# Patient Record
Sex: Female | Born: 1975
Health system: Southern US, Community
[De-identification: ages and names within clinical notes are randomized; demographics above are authoritative.]

## PROBLEM LIST (undated history)

## (undated) DIAGNOSIS — IMO0002 Reserved for concepts with insufficient information to code with codable children: Secondary | ICD-10-CM

## (undated) DIAGNOSIS — I456 Pre-excitation syndrome: Secondary | ICD-10-CM

## (undated) DIAGNOSIS — E538 Deficiency of other specified B group vitamins: Secondary | ICD-10-CM

## (undated) DIAGNOSIS — J302 Other seasonal allergic rhinitis: Secondary | ICD-10-CM

## (undated) DIAGNOSIS — R102 Pelvic and perineal pain: Secondary | ICD-10-CM

## (undated) DIAGNOSIS — Z973 Presence of spectacles and contact lenses: Secondary | ICD-10-CM

## (undated) DIAGNOSIS — I493 Ventricular premature depolarization: Secondary | ICD-10-CM

## (undated) HISTORY — DX: Pre-excitation syndrome: I45.6

## (undated) HISTORY — PX: LAPAROSCOPY FOR ECTOPIC PREGNANCY: SUR765

## (undated) HISTORY — DX: Ventricular premature depolarization: I49.3

## (undated) HISTORY — DX: Other seasonal allergic rhinitis: J30.2

---

## 2000-07-22 HISTORY — PX: BREAST ENHANCEMENT SURGERY: SHX7

## 2005-04-07 ENCOUNTER — Ambulatory Visit: Payer: Self-pay | Admitting: Surgery

## 2008-11-12 ENCOUNTER — Ambulatory Visit (HOSPITAL_COMMUNITY): Admission: RE | Admit: 2008-11-12 | Discharge: 2008-11-12 | Payer: Self-pay | Admitting: Gynecology

## 2008-12-31 ENCOUNTER — Ambulatory Visit (HOSPITAL_COMMUNITY): Admission: AD | Admit: 2008-12-31 | Discharge: 2008-12-31 | Payer: Self-pay | Admitting: Obstetrics and Gynecology

## 2008-12-31 ENCOUNTER — Encounter (INDEPENDENT_AMBULATORY_CARE_PROVIDER_SITE_OTHER): Payer: Self-pay | Admitting: Obstetrics and Gynecology

## 2009-11-02 ENCOUNTER — Inpatient Hospital Stay (HOSPITAL_COMMUNITY): Admission: AD | Admit: 2009-11-02 | Discharge: 2009-11-04 | Payer: Self-pay | Admitting: Obstetrics and Gynecology

## 2009-11-03 ENCOUNTER — Encounter (INDEPENDENT_AMBULATORY_CARE_PROVIDER_SITE_OTHER): Payer: Self-pay | Admitting: Obstetrics and Gynecology

## 2010-06-03 ENCOUNTER — Ambulatory Visit: Payer: Self-pay | Admitting: Internal Medicine

## 2010-09-07 LAB — CBC
HCT: 34.4 % — ABNORMAL LOW (ref 36.0–46.0)
HCT: 37.4 % (ref 36.0–46.0)
Hemoglobin: 12 g/dL (ref 12.0–15.0)
Hemoglobin: 12.9 g/dL (ref 12.0–15.0)
MCHC: 34.4 g/dL (ref 30.0–36.0)
MCHC: 35 g/dL (ref 30.0–36.0)
MCV: 95.6 fL (ref 78.0–100.0)
MCV: 96.1 fL (ref 78.0–100.0)
Platelets: 139 10*3/uL — ABNORMAL LOW (ref 150–400)
Platelets: 163 10*3/uL (ref 150–400)
RBC: 3.58 MIL/uL — ABNORMAL LOW (ref 3.87–5.11)
RBC: 3.91 MIL/uL (ref 3.87–5.11)
RDW: 13.3 % (ref 11.5–15.5)
RDW: 13.5 % (ref 11.5–15.5)
WBC: 12.1 10*3/uL — ABNORMAL HIGH (ref 4.0–10.5)
WBC: 13.5 10*3/uL — ABNORMAL HIGH (ref 4.0–10.5)

## 2010-09-07 LAB — CCBB MATERNAL DONOR DRAW

## 2010-09-07 LAB — RPR: RPR Ser Ql: NONREACTIVE

## 2010-09-27 LAB — CBC
HCT: 42.7 % (ref 36.0–46.0)
Hemoglobin: 14.8 g/dL (ref 12.0–15.0)
MCHC: 34.6 g/dL (ref 30.0–36.0)
MCV: 93.9 fL (ref 78.0–100.0)
Platelets: 194 10*3/uL (ref 150–400)
RBC: 4.54 MIL/uL (ref 3.87–5.11)
RDW: 11.4 % — ABNORMAL LOW (ref 11.5–15.5)
WBC: 8.5 10*3/uL (ref 4.0–10.5)

## 2010-09-27 LAB — ABO/RH: ABO/RH(D): A POS

## 2010-09-27 LAB — HCG, QUANTITATIVE, PREGNANCY: hCG, Beta Chain, Quant, S: 309 m[IU]/mL — ABNORMAL HIGH (ref <5)

## 2010-11-03 NOTE — H&P (Signed)
Dana Graves, Dana Graves           ACCOUNT NO.:  [id number]   MEDICAL RECORD NO.:  [id number]          PATIENT TYPE:  AMB   LOCATION:  [facility]                          FACILITY:  [facility]   PHYSICIAN:  Juluis Mire, M.D.   DATE OF BIRTH:  11-07-1975   DATE OF ADMISSION:  12/31/2008  DATE OF DISCHARGE:  12/31/2008                              HISTORY & PHYSICAL   The patient is a 35 year old, gravida 2, para 1, female with last  menstrual period of November 21, 2008.  She conceived on Clomid.  Has been  followed by Dr. Lorenso Courier with serial quantitative levels.  Has been on  progesterone supplementation.  Quantitative yesterday was 397.6.  Because of increasing pain and discomfort, came in today for an  ultrasound which revealed abundant free fluid.  Subsequent quantitative  urine on maternity admission had dropped to 309.  She had been admitted  for diagnostic laparoscopy to rule out ectopic.   In terms of allergies, she is allergic to AUGMENTIN and BIAXIN that  causes nausea.   MEDICATIONS:  On progesterone supplementation.   PAST MEDICAL HISTORY:  Usual childhood diseases.   SURGERY:  Previous breast augmentation and one previous vaginal  delivery.   FAMILY HISTORY:  Noncontributory.   Social history reveals no tobacco or alcohol use.   REVIEW OF SYSTEMS:  Noncontributory.   PHYSICAL EXAMINATION:  VITAL SIGNS:  The patient is afebrile, stable  vital signs.  LUNGS:  Clear.  CARDIOVASCULAR:  Regular rhythm and rate without murmurs or gallops.  ABDOMEN:  Moderate tenderness with rebound.  Bowel sounds are active.  PELVIC:  Deferred due to recent exam and ultrasound in the office.  NEUROLOGIC:  Grossly within normal limits.   Ultrasound in the office again revealed abundant free fluid, probable  ruptured ectopic pregnancy.  Blood type is undetermined.   IMPRESSION:  Possible ruptured ectopic pregnancy.   PLAN:  The patient had diagnostic laparoscopy with possible removal of  ectopic  versus tube.  The risk of surgery have been explained including  the risk of infection.  The risk of hemorrhage could require transfusion  with the risk of AIDS or hepatitis.  Risk of injury to adjacent organs  including bladder, bowel, ureters that could require further  exploratory surgery.  Risk of deep venous thrombosis and pulmonary  embolus.  Possibility for persistent ectopic pregnancy have been  discussed requiring further surgical or medical management.  The patient  expressed understanding and indications and risks.  Blood type will be  determined.      Juluis Mire, M.D.  Electronically Signed     JSM/MEDQ  D:  12/31/2008  T:  01/01/2009  Job:  [id number]

## 2010-11-03 NOTE — Op Note (Signed)
Dana Graves, Dana Graves           ACCOUNT NO.:  [id number]   MEDICAL RECORD NO.:  [id number]          PATIENT TYPE:  AMB   LOCATION:  [facility]                          [facility]ACILITY:  [facility]   PHYSICIAN:  Juluis Mire, M.D.   DATE OF BIRTH:  1976-04-16   DATE OF PROCEDURE:  12/31/2008  DATE OF DISCHARGE:  12/31/2008                               OPERATIVE REPORT   PREOPERATIVE DIAGNOSIS:  Possible ruptured ectopic pregnancy.   POSTOPERATIVE DIAGNOSIS:  Possible ruptured ectopic pregnancy with an  apparent aborting of the ectopic out the end of the left fallopian tube.   OPERATIVE PROCEDURE:  Laparoscopy with removal of apparent ectopic  pregnancy.   SURGEON:  Juluis Mire, MD   ANESTHESIA:  General.   ESTIMATED BLOOD LOSS:  200 to 300 mL, most of this was already  intraabdominal blood.   PACKS AND DRAINS:  None.   INJECTABLES PLACED:  None.   COMPLICATIONS:  None.   INDICATIONS:  Dictated in history and physical.   PROCEDURE:  The patient was taken to the OR, placed in the supine  position.  After satisfactory level of general endotracheal anesthesia  was obtained, the patient was placed in the dorsal lithotomy position  using the Allen stirrups.  At this point in time, the abdomen and  perineum were cleansed out with Betadine.  Foley was placed straight  drain.  The patient draped in sterile field.  A subumbilical incision  made with knife.  The Veress needle was introduced into abdominal  cavity.  Abdomen was inflated with approximately 3 L of carbon dioxide.  The operating laparoscope was then introduced.  Visualization did reveal  free blood in the pelvic cavity.  A 5-mm trocar was put in place in  suprapubic area, a second 5-mm trocar was put in place in the left lower  quadrant after visualization of epigastric vessels.  Visualization  revealed normal right tube and ovary.  The free blood in the pelvis was  irrigated out.  The left tube had a clot at the end with an  apparent  aborting of the ectopic out of the and there was no active bleeding.  The clot was removed and sent for pathological review.  Revisualization  of the pelvis revealed the uterus to be normal.  Actually, both tubes  looked nice and healthy.  There was no dilation of either tube and no  active bleeding.  The upper abdomen including liver, tip of the  gallbladder, and both lateral gutters were cleared.  We again irrigated  the pelvis, had no active bleeding.  The abdomen was deflated with  carbon dioxide.  All trocars were removed.  Subumbilical incision was  closed with interrupted subcuticular suture of 4-0 Vicryl.  The  suprapubic  incision was closed with Dermabond.  At this point in time, the patient  was taken out of dorsal lithotomy position.  Once alert and extubated,  transferred to recovery room in good condition.  Sponge, instrument, and  needle count was correct by circulating nurse x2.      Juluis Mire, M.D.  Electronically Signed  JSM/MEDQ  D:  12/31/2008  T:  01/01/2009  Job:  [id number]

## 2014-09-12 ENCOUNTER — Other Ambulatory Visit: Payer: Self-pay | Admitting: Obstetrics and Gynecology

## 2014-09-13 LAB — CYTOLOGY - PAP

## 2014-11-26 NOTE — H&P (Addendum)
38 yo with chronic pelvic pain and dysparaunia presents for surgical evaluation and mngt.  Pt with h/o endometriosis  PMHx:  Neg PshX:  SVD, breast augmentation, laparoscopy (ectopic) All:  None Meds:  OCPs Shx:  Negative tobacco FHx:  N/c  AF, VSS gen - NAD ABd - soft, NT CV - RRR Lungs - clear Ext - NT PV - uterus mobile NT, no adnexal masses  [place];  Normal uterus, no free fluid or adnexal masses  A/P:  Laparoscopy with possible fulgeration of em or LOA R/b/a discussed, questions answered, informed consent

## 2014-12-09 ENCOUNTER — Encounter (HOSPITAL_BASED_OUTPATIENT_CLINIC_OR_DEPARTMENT_OTHER): Payer: Self-pay | Admitting: *Deleted

## 2014-12-09 NOTE — Progress Notes (Signed)
NPO AFTER MN.  ARRIVE AT 0601.  NEEDS CBC, SERUM PREG., AND T & S.

## 2014-12-13 ENCOUNTER — Ambulatory Visit (HOSPITAL_BASED_OUTPATIENT_CLINIC_OR_DEPARTMENT_OTHER): Payer: BLUE CROSS/BLUE SHIELD | Admitting: Anesthesiology

## 2014-12-13 ENCOUNTER — Encounter (HOSPITAL_BASED_OUTPATIENT_CLINIC_OR_DEPARTMENT_OTHER): Payer: Self-pay | Admitting: *Deleted

## 2014-12-13 ENCOUNTER — Ambulatory Visit (HOSPITAL_BASED_OUTPATIENT_CLINIC_OR_DEPARTMENT_OTHER)
Admission: RE | Admit: 2014-12-13 | Discharge: 2014-12-13 | Disposition: A | Discharge status: Home / Self Care | Payer: BLUE CROSS/BLUE SHIELD | Source: Ambulatory Visit | Attending: Obstetrics and Gynecology | Admitting: Obstetrics and Gynecology

## 2014-12-13 ENCOUNTER — Encounter (HOSPITAL_BASED_OUTPATIENT_CLINIC_OR_DEPARTMENT_OTHER)
Admission: RE | Disposition: A | Discharge status: Home / Self Care | Payer: Self-pay | Source: Ambulatory Visit | Attending: Obstetrics and Gynecology

## 2014-12-13 DIAGNOSIS — D259 Leiomyoma of uterus, unspecified: Secondary | ICD-10-CM | POA: Insufficient documentation

## 2014-12-13 DIAGNOSIS — G8929 Other chronic pain: Secondary | ICD-10-CM | POA: Diagnosis not present

## 2014-12-13 DIAGNOSIS — N941 Dyspareunia: Secondary | ICD-10-CM | POA: Diagnosis present

## 2014-12-13 HISTORY — DX: Reserved for concepts with insufficient information to code with codable children: IMO0002

## 2014-12-13 HISTORY — DX: Presence of spectacles and contact lenses: Z97.3

## 2014-12-13 HISTORY — DX: Pelvic and perineal pain: R10.2

## 2014-12-13 HISTORY — PX: LAPAROSCOPY: SHX197

## 2014-12-13 LAB — TYPE AND SCREEN
ABO/RH(D): A POS
Antibody Screen: NEGATIVE

## 2014-12-13 LAB — CBC
HCT: 40.8 % (ref 36.0–46.0)
Hemoglobin: 13.7 g/dL (ref 12.0–15.0)
MCH: 30.9 pg (ref 26.0–34.0)
MCHC: 33.6 g/dL (ref 30.0–36.0)
MCV: 91.9 fL (ref 78.0–100.0)
Platelets: 180 10*3/uL (ref 150–400)
RBC: 4.44 MIL/uL (ref 3.87–5.11)
RDW: 12.1 % (ref 11.5–15.5)
WBC: 5.1 10*3/uL (ref 4.0–10.5)

## 2014-12-13 LAB — HCG, SERUM, QUALITATIVE: Preg, Serum: NEGATIVE

## 2014-12-13 LAB — ABO/RH: ABO/RH(D): A POS

## 2014-12-13 SURGERY — LAPAROSCOPY, DIAGNOSTIC
Anesthesia: General | Site: Abdomen

## 2014-12-13 MED ORDER — HYDROMORPHONE HCL 1 MG/ML IJ SOLN
0.2500 mg | INTRAMUSCULAR | Status: DC | PRN
  Filled 2014-12-13: qty 1

## 2014-12-13 MED ORDER — FENTANYL CITRATE (PF) 100 MCG/2ML IJ SOLN
INTRAMUSCULAR | Status: AC
  Filled 2014-12-13: qty 4

## 2014-12-13 MED ORDER — PROMETHAZINE HCL 25 MG/ML IJ SOLN
6.2500 mg | INTRAMUSCULAR | Status: DC | PRN
  Filled 2014-12-13: qty 1

## 2014-12-13 MED ORDER — GLYCOPYRROLATE 0.2 MG/ML IJ SOLN
INTRAMUSCULAR | Status: DC | PRN
  Administered 2014-12-13: 0.4 mg via INTRAVENOUS

## 2014-12-13 MED ORDER — FENTANYL CITRATE (PF) 100 MCG/2ML IJ SOLN
INTRAMUSCULAR | Status: DC | PRN
  Administered 2014-12-13: 50 ug via INTRAVENOUS
  Administered 2014-12-13: 100 ug via INTRAVENOUS

## 2014-12-13 MED ORDER — MIDAZOLAM HCL 2 MG/2ML IJ SOLN
0.5000 mg | Freq: Once | INTRAMUSCULAR | Status: DC | PRN
  Filled 2014-12-13: qty 2

## 2014-12-13 MED ORDER — LACTATED RINGERS IR SOLN
Status: DC | PRN
Start: 2014-12-13 — End: 2014-12-13
  Administered 2014-12-13: 3000 mL

## 2014-12-13 MED ORDER — ROCURONIUM BROMIDE 100 MG/10ML IV SOLN
INTRAVENOUS | Status: DC | PRN
  Administered 2014-12-13: 30 mg via INTRAVENOUS

## 2014-12-13 MED ORDER — MIDAZOLAM HCL 5 MG/5ML IJ SOLN
INTRAMUSCULAR | Status: DC | PRN
  Administered 2014-12-13: 2 mg via INTRAVENOUS

## 2014-12-13 MED ORDER — PROPOFOL 10 MG/ML IV BOLUS
INTRAVENOUS | Status: DC | PRN
  Administered 2014-12-13: 150 mg via INTRAVENOUS

## 2014-12-13 MED ORDER — CEFOTETAN DISODIUM-DEXTROSE 2-2.08 GM-% IV SOLR
INTRAVENOUS | Status: AC
  Filled 2014-12-13: qty 50

## 2014-12-13 MED ORDER — NEOSTIGMINE METHYLSULFATE 10 MG/10ML IV SOLN
INTRAVENOUS | Status: DC | PRN
  Administered 2014-12-13: 3 mg via INTRAVENOUS

## 2014-12-13 MED ORDER — MIDAZOLAM HCL 2 MG/2ML IJ SOLN
INTRAMUSCULAR | Status: AC
  Filled 2014-12-13: qty 2

## 2014-12-13 MED ORDER — MEPERIDINE HCL 25 MG/ML IJ SOLN
6.2500 mg | INTRAMUSCULAR | Status: DC | PRN
  Filled 2014-12-13: qty 1

## 2014-12-13 MED ORDER — LACTATED RINGERS IV SOLN
INTRAVENOUS | Status: DC
  Administered 2014-12-13 (×2): via INTRAVENOUS
  Filled 2014-12-13: qty 1000

## 2014-12-13 MED ORDER — OXYCODONE-ACETAMINOPHEN 5-325 MG PO TABS: 1.0000 | ORAL_TABLET | ORAL | Status: DC | PRN

## 2014-12-13 MED ORDER — LIDOCAINE HCL (CARDIAC) 20 MG/ML IV SOLN
INTRAVENOUS | Status: DC | PRN
  Administered 2014-12-13: 20 mg via INTRAVENOUS

## 2014-12-13 MED ORDER — DEXAMETHASONE SODIUM PHOSPHATE 4 MG/ML IJ SOLN
INTRAMUSCULAR | Status: DC | PRN
  Administered 2014-12-13: 10 mg via INTRAVENOUS

## 2014-12-13 MED ORDER — ONDANSETRON HCL 4 MG/2ML IJ SOLN
INTRAMUSCULAR | Status: DC | PRN
  Administered 2014-12-13: 4 mg via INTRAVENOUS

## 2014-12-13 MED ORDER — DEXTROSE 5 % IV SOLN
2.0000 g | INTRAVENOUS | Status: AC
  Administered 2014-12-13: 2 g via INTRAVENOUS
  Filled 2014-12-13: qty 2

## 2014-12-13 MED ORDER — IBUPROFEN 600 MG PO TABS: 600.0000 mg | ORAL_TABLET | Freq: Four times a day (QID) | ORAL | Status: DC | PRN

## 2014-12-13 MED ORDER — ACETAMINOPHEN 10 MG/ML IV SOLN
INTRAVENOUS | Status: DC | PRN
  Administered 2014-12-13: 1000 mg via INTRAVENOUS

## 2014-12-13 MED ORDER — BUPIVACAINE HCL (PF) 0.25 % IJ SOLN
INTRAMUSCULAR | Status: DC | PRN
Start: 2014-12-13 — End: 2014-12-13
  Administered 2014-12-13: 15 mL

## 2014-12-13 MED ORDER — KETOROLAC TROMETHAMINE 30 MG/ML IJ SOLN
INTRAMUSCULAR | Status: DC | PRN
  Administered 2014-12-13: 30 mg via INTRAVENOUS

## 2014-12-13 SURGICAL SUPPLY — 33 items
APPLICATOR COTTON TIP 6IN STRL (MISCELLANEOUS) ×2 IMPLANT
BLADE SURG 11 STRL SS (BLADE) ×2 IMPLANT
CANISTER SUCTION 2500CC (MISCELLANEOUS) ×2 IMPLANT
CATH ROBINSON RED A/P 16FR (CATHETERS) ×2 IMPLANT
CHLORAPREP W/TINT 26ML (MISCELLANEOUS) ×2 IMPLANT
DRAPE UNDERBUTTOCKS STRL (DRAPE) ×2 IMPLANT
ELECT REM PT RETURN 9FT ADLT (ELECTROSURGICAL) ×2
ELECTRODE REM PT RTRN 9FT ADLT (ELECTROSURGICAL) ×1 IMPLANT
GLOVE BIO SURGEON STRL SZ 6.5 (GLOVE) ×4 IMPLANT
GLOVE BIO SURGEON STRL SZ7 (GLOVE) ×2 IMPLANT
GLOVE INDICATOR 7.0 STRL GRN (GLOVE) ×4 IMPLANT
GOWN STRL REUS W/ TWL LRG LVL3 (GOWN DISPOSABLE) ×3 IMPLANT
GOWN STRL REUS W/TWL LRG LVL3 (GOWN DISPOSABLE) ×3
LIQUID BAND (GAUZE/BANDAGES/DRESSINGS) ×2 IMPLANT
NEEDLE HYPO 25X1 1.5 SAFETY (NEEDLE) ×2 IMPLANT
NS IRRIG 500ML POUR BTL (IV SOLUTION) ×2 IMPLANT
PACK BASIN DAY SURGERY FS (CUSTOM PROCEDURE TRAY) ×2 IMPLANT
PACK LAPAROSCOPY II (CUSTOM PROCEDURE TRAY) ×2 IMPLANT
PAD OB MATERNITY 4.3X12.25 (PERSONAL CARE ITEMS) ×2 IMPLANT
PAD PREP 24X48 CUFFED NSTRL (MISCELLANEOUS) ×2 IMPLANT
PADDING ION DISPOSABLE (MISCELLANEOUS) ×2 IMPLANT
SET IRRIG TUBING LAPAROSCOPIC (IRRIGATION / IRRIGATOR) ×2 IMPLANT
SOLUTION ANTI FOG 6CC (MISCELLANEOUS) ×2 IMPLANT
SUT VIC AB 3-0 PS2 18 (SUTURE) ×1
SUT VIC AB 3-0 PS2 18XBRD (SUTURE) ×1 IMPLANT
SUT VICRYL 0 UR6 27IN ABS (SUTURE) ×2 IMPLANT
SYR 3ML 23GX1 SAFETY (SYRINGE) ×2 IMPLANT
SYR CONTROL 10ML LL (SYRINGE) ×2 IMPLANT
TOWEL OR 17X24 6PK STRL BLUE (TOWEL DISPOSABLE) ×4 IMPLANT
TRAY DSU PREP LF (CUSTOM PROCEDURE TRAY) ×2 IMPLANT
TROCAR OPTI TIP 5M 100M (ENDOMECHANICALS) ×2 IMPLANT
TROCAR XCEL NON-BLD 11X100MML (ENDOMECHANICALS) ×2 IMPLANT
TUBING INSUFFLATION 10FT LAP (TUBING) ×2 IMPLANT

## 2014-12-13 NOTE — Discharge Instructions (Signed)
Diagnostic Laparoscopy Laparoscopy is a surgical procedure. It is used to diagnose and treat diseases inside the belly (abdomen). It is usually a brief, common, and relatively simple procedure. The laparoscopeis a thin, lighted, pencil-sized instrument. It is like a telescope. It is inserted into your abdomen through a small cut (incision). Your caregiver can look at the organs inside your body through this instrument. He or she can see if there is anything abnormal. Laparoscopy can be done either in a hospital or outpatient clinic. You may be given a mild sedative to help you relax before the procedure. Once in the operating room, you will be given a drug to make you sleep (general anesthesia). Laparoscopy usually lasts less than 1 hour. After the procedure, you will be monitored in a recovery area until you are stable and doing well. Once you are home, it will take 2 to 3 days to fully recover. RISKS AND COMPLICATIONS  Laparoscopy has relatively few risks. Your caregiver will discuss the risks with you before the procedure. Some problems that can occur include:  Infection.  Bleeding.  Damage to other organs.  Anesthetic side effects. PROCEDURE Once you receive anesthesia, your surgeon inflates the abdomen with a harmless gas (carbon dioxide). This makes the organs easier to see. The laparoscope is inserted into the abdomen through a small incision. This allows your surgeon to see into the abdomen. Other small instruments are also inserted into the abdomen through other small openings. Many surgeons attach a video camera to the laparoscope to enlarge the view. During a diagnostic laparoscopy, the surgeon may be looking for inflammation, infection, or cancer. Your surgeon may take tissue samples(biopsies). The samples are sent to a specialist in looking at cells and tissue samples (pathologist). The pathologist examines them under a microscope. Biopsies can help to diagnose or confirm a  disease. AFTER THE PROCEDURE   The gas is released from inside the abdomen.  The incisions are closed with stitches (sutures). Because these incisions are small (usually less than 1/2 inch), there is usually minimal discomfort after the procedure. There may be some mild discomfort in the throat. This is from the tube placed in the throat while you were sleeping. You may have some mild abdominal discomfort. There may also be discomfort from the instrument placement incisions in the abdomen.  The recovery time is shortened as long as there are no complications.  You will rest in a recovery room until stable and doing well. As long as there are no complications, you may be allowed to go home. FINDING OUT THE RESULTS OF YOUR TEST Not all test results are available during your visit. If your test results are not back during the visit, make an appointment with your caregiver to find out the results. Do not assume everything is normal if you have not heard from your caregiver or the medical facility. It is important for you to follow up on all of your test results. HOME CARE INSTRUCTIONS   Take all medicines as directed.  Only take over-the-counter or prescription medicines for pain, discomfort, or fever as directed by your caregiver.  Resume daily activities as directed.  Showers are preferred over baths.  You may resume sexual activities in 1 week or as directed.  Do not drive while taking narcotics. SEEK MEDICAL CARE IF:   There is increasing abdominal pain.  There is new pain in the shoulders (shoulder strap areas).  You feel lightheaded or faint.  You have the chills.  You or  your child has an oral temperature above 102 F (38.9 C).  There is pus-like (purulent) drainage from any of the wounds.  You are unable to pass gas or have a bowel movement.  You feel sick to your stomach (nauseous) or throw up (vomit). MAKE SURE YOU:   Understand these instructions.  Will watch  your condition.  Will get help right away if you are not doing well or get worse. Document Released: 09/13/2000 Document Revised: 10/02/2012 Document Reviewed: 06/07/2007 [employer] Patient Information 2015 [place], [place]. This information is not  Post Anesthesia Home Care Instructions  Activity: Get plenty of rest for the remainder of the day. A responsible adult should stay with you for 24 hours following the procedure.  For the next 24 hours, DO NOT: -Drive a car -Paediatric nurse -Drink alcoholic beverages -Take any medication unless instructed by your physician -Make any legal decisions or sign important papers.  Meals: Start with liquid foods such as gelatin or soup. Progress to regular foods as tolerated. Avoid greasy, spicy, heavy foods. If nausea and/or vomiting occur, drink only clear liquids until the nausea and/or vomiting subsides. Call your physician if vomiting continues.  Special Instructions/Symptoms: Your throat may feel dry or sore from the anesthesia or the breathing tube placed in your throat during surgery. If this causes discomfort, gargle with warm salt water. The discomfort should disappear within 24 hours.  If you had a scopolamine patch placed behind your ear for the management of post- operative nausea and/or vomiting:  1. The medication in the patch is effective for 72 hours, after which it should be removed.  Wrap patch in a tissue and discard in the trash. Wash hands thoroughly with soap and water. 2. You may remove the patch earlier than 72 hours if you experience unpleasant side effects which may include dry mouth, dizziness or visual disturbances. 3. Avoid touching the patch. Wash your hands with soap and water after contact with the patch.    Post Anesthesia Home Care Instructions  Activity: Get plenty of rest for the remainder of the day. A responsible adult should stay with you for 24 hours following the procedure.  For the next 24 hours, DO  NOT: -Drive a car -Paediatric nurse -Drink alcoholic beverages -Take any medication unless instructed by your physician -Make any legal decisions or sign important papers.  Meals: Start with liquid foods such as gelatin or soup. Progress to regular foods as tolerated. Avoid greasy, spicy, heavy foods. If nausea and/or vomiting occur, drink only clear liquids until the nausea and/or vomiting subsides. Call your physician if vomiting continues.  Special Instructions/Symptoms: Your throat may feel dry or sore from the anesthesia or the breathing tube placed in your throat during surgery. If this causes discomfort, gargle with warm salt water. The discomfort should disappear within 24 hours.  If you had a scopolamine patch placed behind your ear for the management of post- operative nausea and/or vomiting:  1. The medication in the patch is effective for 72 hours, after which it should be removed.  Wrap patch in a tissue and discard in the trash. Wash hands thoroughly with soap and water. 2. You may remove the patch earlier than 72 hours if you experience unpleasant side effects which may include dry mouth, dizziness or visual disturbances. 3. Avoid touching the patch. Wash your hands with soap and water after contact with the patch.   intended to replace advice given to you by your health care provider. Make sure you  discuss any questions you have with your health care provider.

## 2014-12-13 NOTE — Op Note (Signed)
Dana Graves, Dana Graves           ACCOUNT NO.:  [id number]  MEDICAL RECORD NO.:  [id number]  LOCATION:                               FACILITY:  [facility]  PHYSICIAN:  Marylynn Pearson, MD    DATE OF BIRTH:  03-27-1976  DATE OF PROCEDURE:  12/13/2014 DATE OF DISCHARGE:  12/13/2014                              OPERATIVE REPORT   PREOPERATIVE DIAGNOSIS:  Dyspareunia.  POSTOPERATIVE DIAGNOSIS:  Dyspareunia.  PROCEDURE:  Diagnostic laparoscopy.  SURGEON:  Marylynn Pearson, MD  BLOOD LOSS:  Minimal.  COMPLICATIONS:  None.  SPECIMENS:  None.  CONDITION:  Stable to recovery room.  PROCEDURE IN DETAIL:  The patient was taken to the operating room. After informed consent was obtained, she was given general anesthesia and placed in the dorsal lithotomy position using Allen stirrups.  She was prepped and draped in sterile fashion.  In-and-out catheter was used to drain her bladder.  Bivalve speculum was placed in the vagina and a single-tooth tenaculum was attached to the anterior lip of the cervix. The Hulka clamp was placed.  Tenaculum and speculum were removed and our attention was turned to the abdomen.  An umbilical incision was made with a scalpel and extended bluntly to the fascia using a Kelly clamp.  Optical trocar was then inserted under direct visualization.  Once intraperitoneal placement was confirmed, CO2 was turned on and a survey of the abdomen and pelvis were performed. Bilateral ovaries and fallopian tubes appeared free of adhesions with no abnormalities.  The uterus had a very small fundal fibroid, but was mobile without abnormalities.  Posterior cul-de-sac and ovarian fossa, both were visualized and appeared normal.  No evidence of endometriosis or adhesions within the pelvis.  Right upper quadrant was visualized and also appeared normal.  All trocars and instruments were then removed from the abdomen.  CO2 was turned off and a deep stitch was placed  in the umbilical incision and the skin was closed with Vicryl.  Dermabond was placed over both incisions.  The Hulka clamp was removed.  She was extubated and taken to the recovery room in stable condition.  Sponge, lap, needle, and instrument counts were correct x2.     Marylynn Pearson, MD     GA/MEDQ  D:  12/13/2014  T:  12/13/2014  Job:  [id number]

## 2014-12-13 NOTE — Anesthesia Procedure Notes (Signed)
Procedure Name: Intubation Date/Time: 12/13/2014 7:29 AM Performed by: Bethena Roys T Pre-anesthesia Checklist: Patient identified, Emergency Drugs available, Suction available and Patient being monitored Patient Re-evaluated:Patient Re-evaluated prior to inductionOxygen Delivery Method: Circle System Utilized Preoxygenation: Pre-oxygenation with 100% oxygen Intubation Type: IV induction Ventilation: Mask ventilation without difficulty Laryngoscope Size: Mac and 3 Grade View: Grade I Tube type: Oral Number of attempts: 2 (first look , Grade 3 view, waited and intubated easily on 2nd look) Airway Equipment and Method: Stylet and Oral airway Placement Confirmation: ETT inserted through vocal cords under direct vision,  positive ETCO2 and breath sounds checked- equal and bilateral Tube secured with: Tape Dental Injury: Teeth and Oropharynx as per pre-operative assessment

## 2014-12-13 NOTE — Anesthesia Preprocedure Evaluation (Addendum)
Anesthesia Evaluation  Patient identified by MRN, date of birth, ID band Patient awake    Reviewed: Allergy & Precautions, NPO status , Patient's Chart, lab work & pertinent test results  History of Anesthesia Complications Negative for: history of anesthetic complications  Airway Mallampati: I  TM Distance: >3 FB Neck ROM: Full    Dental  (+) Dental Advisory Given, Teeth Intact   Pulmonary neg pulmonary ROS,  breath sounds clear to auscultation        Cardiovascular negative cardio ROS  Rhythm:Regular Rate:Normal     Neuro/Psych negative neurological ROS     GI/Hepatic negative GI ROS, Neg liver ROS,   Endo/Other  negative endocrine ROS  Renal/GU negative Renal ROS     Musculoskeletal   Abdominal   Peds  Hematology negative hematology ROS (+)   Anesthesia Other Findings   Reproductive/Obstetrics 12/13/14 preg test: NEG                            Anesthesia Physical Anesthesia Plan  ASA: I  Anesthesia Plan: General   Post-op Pain Management:    Induction: Intravenous  Airway Management Planned: Oral ETT  Additional Equipment:   Intra-op Plan:   Post-operative Plan: Extubation in OR  Informed Consent: I have reviewed the patients History and Physical, chart, labs and discussed the procedure including the risks, benefits and alternatives for the proposed anesthesia with the patient or authorized representative who has indicated his/her understanding and acceptance.   Dental advisory given  Plan Discussed with: CRNA and Surgeon  Anesthesia Plan Comments: (Plan routine monitors, GETA)        Anesthesia Quick Evaluation

## 2014-12-13 NOTE — Transfer of Care (Signed)
Immediate Anesthesia Transfer of Care Note  Patient: Dana Graves  Procedure(s) Performed: Procedure(s): LAPAROSCOPY DIAGNOSTIC  (N/A)  Patient Location: PACU  Anesthesia Type:General  Level of Consciousness: awake, alert  and oriented  Airway & Oxygen Therapy: Patient Spontanous Breathing, O2 per [place]   Post-op Assessment: Report given to RN and Post -op Vital signs reviewed and stable  Post vital signs: Reviewed and stable  Last Vitals:  Filed Vitals:   12/13/14 0603  BP: 100/64  Pulse: 61  Temp: 36.9 C  Resp: 14    Complications: No apparent anesthesia complications

## 2014-12-13 NOTE — Anesthesia Postprocedure Evaluation (Signed)
  Anesthesia Post-op Note  Patient: Dana Graves  Procedure(s) Performed: Procedure(s): LAPAROSCOPY DIAGNOSTIC  (N/A)  Patient Location: PACU  Anesthesia Type:General  Level of Consciousness: awake, alert , oriented and patient cooperative  Airway and Oxygen Therapy: Patient Spontanous Breathing  Post-op Pain: none  Post-op Assessment: Post-op Vital signs reviewed, Patient's Cardiovascular Status Stable, Respiratory Function Stable, Patent Airway, No signs of Nausea or vomiting and Pain level controlled              Post-op Vital Signs: Reviewed and stable  Last Vitals:  Filed Vitals:   12/13/14 0900  BP: 109/60  Pulse: 52  Temp:   Resp: 11    Complications: No apparent anesthesia complications

## 2014-12-16 ENCOUNTER — Encounter (HOSPITAL_BASED_OUTPATIENT_CLINIC_OR_DEPARTMENT_OTHER): Payer: Self-pay | Admitting: Obstetrics and Gynecology

## 2015-02-04 ENCOUNTER — Ambulatory Visit: Payer: Self-pay | Admitting: Family Medicine

## 2015-02-12 ENCOUNTER — Encounter (INDEPENDENT_AMBULATORY_CARE_PROVIDER_SITE_OTHER): Payer: Self-pay

## 2015-02-12 ENCOUNTER — Encounter: Payer: Self-pay | Admitting: Family Medicine

## 2015-02-12 ENCOUNTER — Ambulatory Visit (INDEPENDENT_AMBULATORY_CARE_PROVIDER_SITE_OTHER): Payer: BLUE CROSS/BLUE SHIELD | Admitting: Family Medicine

## 2015-02-12 VITALS — BP 124/72 | HR 76 | Temp 98.2°F | Ht 67.5 in | Wt 140.8 lb

## 2015-02-12 DIAGNOSIS — Z01419 Encounter for gynecological examination (general) (routine) without abnormal findings: Secondary | ICD-10-CM

## 2015-02-12 DIAGNOSIS — R140 Abdominal distension (gaseous): Secondary | ICD-10-CM

## 2015-02-12 DIAGNOSIS — Z0000 Encounter for general adult medical examination without abnormal findings: Secondary | ICD-10-CM

## 2015-02-12 LAB — COMPREHENSIVE METABOLIC PANEL
ALT: 13 U/L (ref 0–35)
AST: 24 U/L (ref 0–37)
Albumin: 4.6 g/dL (ref 3.5–5.2)
Alkaline Phosphatase: 46 U/L (ref 39–117)
BUN: 19 mg/dL (ref 6–23)
CO2: 27 mEq/L (ref 19–32)
Calcium: 9.5 mg/dL (ref 8.4–10.5)
Chloride: 104 mEq/L (ref 96–112)
Creatinine, Ser: 0.78 mg/dL (ref 0.40–1.20)
GFR: 86.59 mL/min (ref >60.00)
Glucose, Bld: 89 mg/dL (ref 70–99)
Potassium: 4.7 mEq/L (ref 3.5–5.1)
Sodium: 138 mEq/L (ref 135–145)
Total Bilirubin: 0.4 mg/dL (ref 0.2–1.2)
Total Protein: 7.5 g/dL (ref 6.0–8.3)

## 2015-02-12 LAB — CBC WITH DIFFERENTIAL/PLATELET
Basophils Absolute: 0 10*3/uL (ref 0.0–0.1)
Basophils Relative: 0.3 % (ref 0.0–3.0)
Eosinophils Absolute: 0.1 10*3/uL (ref 0.0–0.7)
Eosinophils Relative: 1.2 % (ref 0.0–5.0)
HCT: 41.6 % (ref 36.0–46.0)
Hemoglobin: 14.3 g/dL (ref 12.0–15.0)
Lymphocytes Relative: 28.2 % (ref 12.0–46.0)
Lymphs Abs: 2.1 10*3/uL (ref 0.7–4.0)
MCHC: 34.3 g/dL (ref 30.0–36.0)
MCV: 92 fl (ref 78.0–100.0)
Monocytes Absolute: 0.6 10*3/uL (ref 0.1–1.0)
Monocytes Relative: 7.6 % (ref 3.0–12.0)
Neutro Abs: 4.7 10*3/uL (ref 1.4–7.7)
Neutrophils Relative %: 62.7 % (ref 43.0–77.0)
Platelets: 230 10*3/uL (ref 150.0–400.0)
RBC: 4.53 Mil/uL (ref 3.87–5.11)
RDW: 11.9 % (ref 11.5–15.5)
WBC: 7.6 10*3/uL (ref 4.0–10.5)

## 2015-02-12 LAB — H. PYLORI ANTIBODY, IGG: H Pylori IgG: NEGATIVE

## 2015-02-12 LAB — VITAMIN B12: Vitamin B-12: 225 pg/mL (ref 211–911)

## 2015-02-12 LAB — LIPID PANEL
Cholesterol: 164 mg/dL (ref 0–200)
HDL: 58.2 mg/dL (ref >39.00)
LDL Cholesterol: 73 mg/dL (ref 0–99)
NonHDL: 105.89
Total CHOL/HDL Ratio: 3
Triglycerides: 163 mg/dL — ABNORMAL HIGH (ref 0.0–149.0)
VLDL: 32.6 mg/dL (ref 0.0–40.0)

## 2015-02-12 LAB — MAGNESIUM: Magnesium: 2 mg/dL (ref 1.5–2.5)

## 2015-02-12 LAB — VITAMIN D 25 HYDROXY (VIT D DEFICIENCY, FRACTURES): VITD: 47.34 ng/mL (ref 30.00–100.00)

## 2015-02-12 LAB — TSH: TSH: 1.01 u[IU]/mL (ref 0.35–4.50)

## 2015-02-12 NOTE — Assessment & Plan Note (Signed)
Reviewed preventive care protocols, scheduled due services, and updated immunizations Discussed nutrition, exercise, diet, and healthy lifestyle.  Orders Placed This Encounter  Procedures  . CBC with Differential/Platelet  . Comprehensive metabolic panel  . Lipid panel  . TSH  . Magnesium  . Vitamin B12  . Vitamin D, 25-hydroxy  . H. pylori antibody, IgG

## 2015-02-12 NOTE — Patient Instructions (Signed)
Nice to meet you. We will call you with your lab results and you can view them online.  Try VSL #3 or align OTC

## 2015-02-12 NOTE — Progress Notes (Signed)
Pre visit review using our clinic review tool, if applicable. No additional management support is needed unless otherwise documented below in the visit note. 

## 2015-02-12 NOTE — Progress Notes (Signed)
Subjective:   Patient ID: Dana Graves, female    DOB: [birth year], 39 y.o.   MRN: [id number]  Dana Graves is a pleasant 39 y.o. year old female who presents to clinic today with Establish Care  on 02/12/2015  HPI: G3P2- in good health. Has OBGYN-  Dr. Orvan Seen.  Last pap and mammogram in 06/2014.  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  Very active.  Has been having more abdominal bloating and gassiness.  No changes in bowel habits or abdominal pain.  No blood in stool, nausea or vomiting.  Had a laparoscopy which ruled out endometriosis.  Has not tried gas x or probiotics.  Does not seem to affected by dairy or gluten.  Current Outpatient Prescriptions on File Prior to Visit  Medication Sig Dispense Refill  . norgestimate-ethinyl estradiol (ORTHO-CYCLEN,SPRINTEC,PREVIFEM) 0.25-35 MG-MCG tablet Take 1 tablet by mouth daily.     No current facility-administered medications on file prior to visit.    No Known Allergies  Past Medical History  Diagnosis Date  . Pelvic pain in female   . Wears contact lenses   . Dyspareunia     Past Surgical History  Procedure Laterality Date  . Laparoscopy for ectopic pregnancy  2009 approx.    Unilateral Salpingostomy  . Breast enhancement surgery  feb  2002  . Laparoscopy N/A 12/13/2014    Procedure: LAPAROSCOPY DIAGNOSTIC ;  Surgeon: Marylynn Pearson, MD;  Location: [facility];  Service: Gynecology;  Laterality: N/A;    Family History  Problem Relation Age of Onset  . Cancer Maternal Grandmother   . Cancer Paternal Grandfather   . Sudden death Paternal Grandfather     Social History   Social History  . Marital Status: Married    Spouse Name: N/A  . Number of Children: N/A  . Years of Education: N/A   Occupational History  . Not on file.   Social History Main Topics  . Smoking status: Never Smoker   . Smokeless tobacco: Never Used  . Alcohol Use: 4.2 oz/week    7 Glasses of wine per week     Comment: 1-2 DAILY  WINE  . Drug Use: No  . Sexual Activity: Yes    Birth Control/ Protection: Pill   Other Topics Concern  . Not on file   Social History Narrative   The PMH, PSH, Social History, Family History, Medications, and allergies have been reviewed in [facility], and have been updated if relevant.     Review of Systems  Constitutional: Negative.   HENT: Negative.   Eyes: Negative.   Respiratory: Negative.   Cardiovascular: Negative.   Gastrointestinal: Positive for abdominal distention. Negative for nausea, vomiting, abdominal pain, diarrhea, constipation, blood in stool, anal bleeding and rectal pain.  Endocrine: Negative.   Genitourinary: Negative.   Musculoskeletal: Negative.   Skin: Negative.   Allergic/Immunologic: Negative.   Neurological: Negative.   Hematological: Negative.   Psychiatric/Behavioral: Negative.   All other systems reviewed and are negative.      Objective:    BP 124/72 mmHg  Pulse 76  Temp(Src) 98.2 F (36.8 C) (Oral)  Ht 5' 7.5" (1.715 m)  Wt 140 lb 12 oz (63.844 kg)  BMI 21.71 kg/m2  SpO2 98%  LMP 01/27/2015   Physical Exam   General:  Well-developed,well-nourished,in no acute distress; alert,appropriate and cooperative throughout examination Head:  normocephalic and atraumatic.   Eyes:  vision grossly intact, pupils equal, pupils round, and pupils reactive to light.   Ears:  R ear normal and L ear normal.   Nose:  no external deformity.   Mouth:  good dentition.   Neck:  No deformities, masses, or tenderness noted. Lungs:  Normal respiratory effort, chest expands symmetrically. Lungs are clear to auscultation, no crackles or wheezes. Heart:  Normal rate and regular rhythm. S1 and S2 normal without gallop, murmur, click, rub or other extra sounds. Abdomen:  Bowel sounds positive,abdomen soft and non-tender without masses, organomegaly or hernias noted. Msk:  No deformity or scoliosis noted of thoracic or lumbar spine.   Extremities:  No clubbing,  cyanosis, edema, or deformity noted with normal full range of motion of all joints.   Neurologic:  alert & oriented X3 and gait normal.   Skin:  Intact without suspicious lesions or rashes Psych:  Cognition and judgment appear intact. Alert and cooperative with normal attention span and concentration. No apparent delusions, illusions, hallucinations       Assessment & Plan:   Well woman exam - Plan: CBC with Differential/Platelet, Comprehensive metabolic panel, Lipid panel, TSH, Magnesium, Vitamin B12, Vitamin D, 25-hydroxy  Bloating - Plan: H. pylori antibody, IgG No Follow-up on file.

## 2015-02-12 NOTE — Assessment & Plan Note (Signed)
New- advised probiotic and keeping a symptoms calender. Follow up in 1- 2 months.

## 2015-02-13 ENCOUNTER — Encounter: Payer: Self-pay | Admitting: *Deleted

## 2015-05-08 ENCOUNTER — Ambulatory Visit (INDEPENDENT_AMBULATORY_CARE_PROVIDER_SITE_OTHER): Payer: BLUE CROSS/BLUE SHIELD | Admitting: Family Medicine

## 2015-05-08 ENCOUNTER — Encounter: Payer: Self-pay | Admitting: Family Medicine

## 2015-05-08 VITALS — BP 106/70 | HR 85 | Temp 98.7°F | Ht 67.5 in | Wt 144.0 lb

## 2015-05-08 DIAGNOSIS — R3915 Urgency of urination: Secondary | ICD-10-CM | POA: Diagnosis not present

## 2015-05-08 DIAGNOSIS — N3000 Acute cystitis without hematuria: Secondary | ICD-10-CM

## 2015-05-08 DIAGNOSIS — N390 Urinary tract infection, site not specified: Secondary | ICD-10-CM | POA: Insufficient documentation

## 2015-05-08 LAB — POCT URINALYSIS DIPSTICK
Bilirubin, UA: NEGATIVE
Blood, UA: NEGATIVE
Glucose, UA: NEGATIVE
Ketones, UA: NEGATIVE
Nitrite, UA: NEGATIVE
Protein, UA: NEGATIVE
Spec Grav, UA: 1.01
Urobilinogen, UA: 0.2
pH, UA: 5.5

## 2015-05-08 LAB — URINALYSIS, MICROSCOPIC ONLY: RBC / HPF: NONE SEEN (ref 0–?)

## 2015-05-08 MED ORDER — CEPHALEXIN 500 MG PO CAPS: 500.0000 mg | ORAL_CAPSULE | Freq: Two times a day (BID) | ORAL | Status: DC

## 2015-05-08 NOTE — Patient Instructions (Signed)
Nice to meet you. You have a UTI. We will treat this with Keflex. If you develop abdominal pain, fever, vaginal discharge, chills, or feel poorly, or any new or changing symptoms please seek medical attention.

## 2015-05-08 NOTE — Progress Notes (Signed)
Patient ID: Dana Graves, female   DOB: [birth year], 39 y.o.   MRN: [id number]  Dana Rumps, MD Phone: [phone]  Dana Graves is a 39 y.o. female who presents today for same day visit.  Urinary urgency: Patient notes for the past several days she's had increasing urinary urgency and frequency. She has not had any dysuria. She has not had any pain in her pelvis or abdomen. She's not had any fevers. She's not had any vaginal discharge. She states this feels like her typical UTI. Her last UTI was 6 months ago. She is sexually active one partner and they don't use condoms for never had an STD before.  PMH: nonsmoker.   ROS see history of present illness  Objective  Physical Exam Filed Vitals:   05/08/15 1100  BP: 106/70  Pulse: 85  Temp: 98.7 F (37.1 C)    Physical Exam  Constitutional: She is well-developed, well-nourished, and in no distress.  HENT:  Head: Normocephalic and atraumatic.  Cardiovascular: Normal rate, regular rhythm and normal heart sounds.  Exam reveals no gallop and no friction rub.   No murmur heard. Pulmonary/Chest: Effort normal and breath sounds normal. No respiratory distress. She has no wheezes. She has no rales.  Abdominal: Soft. Bowel sounds are normal. She exhibits no distension. There is no tenderness. There is no rebound and no guarding.  Neurological: She is alert. Gait normal.  Skin: Skin is warm and dry. She is not diaphoretic.     Assessment/Plan: Please see individual problem list.  UTI (urinary tract infection) Symptoms consistent with UTI. UA with trace leukocytes. Patient has benign abdominal exam. She is afebrile and vitals are stable. We will treat with Keflex 500 mg twice a day for 7 days. We'll send for urine culture and micro-. She is given return precautions.    Orders Placed This Encounter  Procedures  . Urine Culture  . Urine Microscopic Only  . POCT Urinalysis Dipstick    Meds ordered this encounter    Medications  . cephALEXin (KEFLEX) 500 MG capsule    Sig: Take 1 capsule (500 mg total) by mouth 2 (two) times daily.    Dispense:  14 capsule    Refill:  0    Dana Graves

## 2015-05-08 NOTE — Assessment & Plan Note (Signed)
Symptoms consistent with UTI. UA with trace leukocytes. Patient has benign abdominal exam. She is afebrile and vitals are stable. We will treat with Keflex 500 mg twice a day for 7 days. We'll send for urine culture and micro-. She is given return precautions.

## 2015-05-08 NOTE — Progress Notes (Signed)
Pre visit review using our clinic review tool, if applicable. No additional management support is needed unless otherwise documented below in the visit note. 

## 2015-05-11 LAB — URINE CULTURE: Colony Count: 10000

## 2015-05-12 ENCOUNTER — Telehealth: Payer: Self-pay | Admitting: Family Medicine

## 2015-05-12 NOTE — Telephone Encounter (Signed)
Attempted to call the patient to inform of urine culture results. There is no answer. I left a message asking for her to call back to the office.  When she calls back please inform her that her urine culture did reveal a urinary tract infection that should be sensitive to the antibiotic she is on. Please inquire whether or not she is improved at this time.

## 2015-05-13 NOTE — Telephone Encounter (Signed)
Patient returned phone call, she has requested her results from her culture.

## 2015-05-13 NOTE — Telephone Encounter (Signed)
Can you call this patient to inform her of her results and see if she is improved. Thanks.

## 2015-05-14 NOTE — Telephone Encounter (Signed)
Patient stated that she saw results on My Chart and she is feeling much better.

## 2015-05-20 ENCOUNTER — Other Ambulatory Visit: Payer: Self-pay | Admitting: Family Medicine

## 2015-05-20 ENCOUNTER — Telehealth: Payer: Self-pay | Admitting: Family Medicine

## 2015-05-20 MED ORDER — PERMETHRIN 1 % EX LOTN: 1.0000 "application " | TOPICAL_LOTION | Freq: Once | CUTANEOUS | Status: DC

## 2015-05-20 MED ORDER — IVERMECTIN 0.5 % EX LOTN: TOPICAL_LOTION | CUTANEOUS | Status: DC

## 2015-05-20 NOTE — Telephone Encounter (Signed)
Pt has returned from vacation and she and her daughters have head lice.  Pt would like to know if she can get prescribed medication for herself without having to come in.   She was previously exposed last year and understands the tx.  Pharmacy of choice [facility] [place].  Pt would like a call back at [phone] so she can pick up ASAP  / lt

## 2015-05-20 NOTE — Telephone Encounter (Signed)
Please call in as entered below.

## 2015-05-20 NOTE — Telephone Encounter (Signed)
Pt advised per Vaughan Basta T

## 2015-05-20 NOTE — Telephone Encounter (Signed)
No she does not need to be seen first.  eRx sent.

## 2016-02-25 ENCOUNTER — Ambulatory Visit
Admission: EM | Admit: 2016-02-25 | Discharge: 2016-02-25 | Disposition: A | Discharge status: Home / Self Care | Payer: BLUE CROSS/BLUE SHIELD | Attending: Family Medicine | Admitting: Family Medicine

## 2016-02-25 ENCOUNTER — Ambulatory Visit (INDEPENDENT_AMBULATORY_CARE_PROVIDER_SITE_OTHER): Payer: BLUE CROSS/BLUE SHIELD

## 2016-02-25 DIAGNOSIS — M5481 Occipital neuralgia: Secondary | ICD-10-CM

## 2016-02-25 DIAGNOSIS — M6248 Contracture of muscle, other site: Secondary | ICD-10-CM | POA: Diagnosis not present

## 2016-02-25 DIAGNOSIS — M62838 Other muscle spasm: Secondary | ICD-10-CM

## 2016-02-25 DIAGNOSIS — M542 Cervicalgia: Secondary | ICD-10-CM | POA: Diagnosis not present

## 2016-02-25 MED ORDER — KETOROLAC TROMETHAMINE 60 MG/2ML IM SOLN
60.0000 mg | Freq: Once | INTRAMUSCULAR | Status: AC
  Administered 2016-02-25: 60 mg via INTRAMUSCULAR

## 2016-02-25 MED ORDER — MELOXICAM 15 MG PO TABS: 15.0000 mg | ORAL_TABLET | Freq: Every day | ORAL | 1 refills | Status: DC

## 2016-02-25 MED ORDER — PREDNISONE 10 MG (21) PO TBPK: ORAL_TABLET | ORAL | 0 refills | Status: DC

## 2016-02-25 MED ORDER — ORPHENADRINE CITRATE ER 100 MG PO TB12: 100.0000 mg | ORAL_TABLET | Freq: Two times a day (BID) | ORAL | 0 refills | Status: DC

## 2016-02-25 NOTE — ED Provider Notes (Signed)
[facility]    CSN: [id number] Arrival date & time: 02/25/16  1033  First Provider Contact:  First MD Initiated Contact with Patient 02/25/16 1126        History   Chief Complaint Chief Complaint  Patient presents with  . Neck Pain    HPI Dana Graves is a 40 y.o. female.   Patient reports over the last 3-4 weeks she's had neck pain. States his pain has been as pleasant not in the middle of her neck. She reports on Sunday the pain became much worse and felt his there was a knife In the back. Because the increased pain she saw a chiropractor on Tuesday and today but because the discomfort was getting worse and she is actually having trouble swallowing she states she opens her mouth swallow the pain is intensified he sent her over to be x-rayed and evaluated. She's had neck problems before that she had a CT scan of her neck about 3 years ago which was negative. She denies any trauma to the neck but states that it does hurt when she swallows excruciating and she has is pain in this throbbing. She has difficulty moving her head and her neck and that she keep the neck still and at that time the pain to 7 if she does any type of movement of her neck and goes up to 10. She has limitation of range of motion of her neck as well now. Past medical history she's had breast augmentation and laparoscopic evaluation for ectopic pregnancy. She does not smoke. No known drug allergies. No pertinent family medical history relevant to today's visit.   The history is provided by the patient. No language interpreter was used.  Neck Pain  Pain location:  Generalized neck Quality:  Aching Pain severity:  Severe Pain is:  Unable to specify Duration:  4 weeks Timing:  Constant Progression:  Worsening Chronicity:  New Context: not fall, not jumping from heights, not lifting a heavy object, not MVC, not pedestrian accident and not recent injury   Relieved by:  Nothing Worsened by:   Coughing, position and swallowing Ineffective treatments:  None tried Associated symptoms: no bladder incontinence, no bowel incontinence, no chest pain, no fever, no leg pain, no numbness, no paresis, no tingling, no visual change, no weakness and no weight loss   Risk factors: no hx of head and neck radiation, no hx of osteoporosis and no recent epidural     Past Medical History:  Diagnosis Date  . Dyspareunia   . Pelvic pain in female   . Wears contact lenses     Patient Active Problem List   Diagnosis Date Noted  . UTI (urinary tract infection) 05/08/2015  . Well woman exam 02/12/2015  . Bloating 02/12/2015    Past Surgical History:  Procedure Laterality Date  . BREAST ENHANCEMENT SURGERY  feb  2002  . LAPAROSCOPY N/A 12/13/2014   Procedure: LAPAROSCOPY DIAGNOSTIC ;  Surgeon: Marylynn Pearson, MD;  Location: [facility];  Service: Gynecology;  Laterality: N/A;  . LAPAROSCOPY FOR ECTOPIC PREGNANCY  2009 approx.   Unilateral Salpingostomy    OB History    No data available       Home Medications    Prior to Admission medications   Medication Sig Start Date End Date Taking? Authorizing Provider  norgestimate-ethinyl estradiol (ORTHO-CYCLEN,SPRINTEC,PREVIFEM) 0.25-35 MG-MCG tablet Take 1 tablet by mouth daily.   Yes Historical Provider, MD  cephALEXin (KEFLEX) 500 MG capsule Take  1 capsule (500 mg total) by mouth 2 (two) times daily. 05/08/15   Leone Haven, MD  meloxicam (MOBIC) 15 MG tablet Take 1 tablet (15 mg total) by mouth daily. 02/25/16   Frederich Cha, MD  orphenadrine (NORFLEX) 100 MG tablet Take 1 tablet (100 mg total) by mouth 2 (two) times daily. 02/25/16   Frederich Cha, MD  permethrin (PERMETHRIN LICE TREATMENT) 1 % lotion Apply 1 application topically once. Shampoo, rinse and towel dry hair, saturate hair and scalp with permethrin. Rinse after 10 min; repeat in 1 week if needed 05/20/15   Lucille Passy, MD  predniSONE (STERAPRED UNI-PAK 21 TAB) 10  MG (21) TBPK tablet Sig 6 tablet day 1, 5 tablets day 2, 4 tablets day 3,,3tablets day 4, 2 tablets day 5, 1 tablet day 6 take all tablets orally 02/25/16   Frederich Cha, MD    Family History Family History  Problem Relation Age of Onset  . Cancer Maternal Grandmother   . Cancer Paternal Grandfather   . Sudden death Paternal Grandfather     Social History Social History  Substance Use Topics  . Smoking status: Never Smoker  . Smokeless tobacco: Never Used  . Alcohol use 4.2 oz/week    7 Glasses of wine per week     Comment: 1-2 DAILY WINE     Allergies   Review of patient's allergies indicates no known allergies.   Review of Systems Review of Systems  Constitutional: Negative for fever and weight loss.  Cardiovascular: Negative for chest pain.  Gastrointestinal: Negative for bowel incontinence.  Genitourinary: Negative for bladder incontinence.  Musculoskeletal: Positive for neck pain.  Neurological: Negative for tingling, weakness and numbness.  All other systems reviewed and are negative.    Physical Exam Triage Vital Signs ED Triage Vitals  Enc Vitals Group     BP 02/25/16 1055 113/72     Pulse Rate 02/25/16 1055 84     Resp 02/25/16 1055 18     Temp 02/25/16 1055 99 F (37.2 C)     Temp Source 02/25/16 1055 Oral     SpO2 02/25/16 1055 100 %     Weight 02/25/16 1053 140 lb (63.5 kg)     Height 02/25/16 1053 5' 8"  (1.727 m)     Head Circumference --      Peak Flow --      Pain Score 02/25/16 1054 10     Pain Loc --      Pain Edu? --      Excl. in [place]? --    No data found.   Updated Vital Signs BP 113/72 (BP Location: Right Arm)   Pulse 84   Temp 99 F (37.2 C) (Oral)   Resp 18   Ht 5' 8"  (1.727 m)   Wt 140 lb (63.5 kg)   LMP 02/13/2016 (Within Days) Comment: denies preg  SpO2 100%   BMI 21.29 kg/m   Visual Acuity Right Eye Distance:   Left Eye Distance:   Bilateral Distance:    Right Eye Near:   Left Eye Near:    Bilateral Near:      Physical Exam  Constitutional: She is oriented to person, place, and time. She appears well-developed and well-nourished.  HENT:  Head: Normocephalic and atraumatic.    Should be noted that the patient even though she is complaining of neck pain is minimal mild tenderness over the cervical spine. Most the tenderness is over the left occipital groove for notch  area. Palpation this area reproduces her pain, since her off the table.  Eyes: Pupils are equal, round, and reactive to light.  Neck: Normal range of motion.  Neck as range of motion persistent not completely able to extend her neck without having increased pain and difficulty. She is able to put the chin down on her chest  Pulmonary/Chest: Effort normal.  Musculoskeletal: She exhibits tenderness. She exhibits no edema or deformity.       Cervical back: She exhibits decreased range of motion, tenderness, bony tenderness and spasm. She exhibits no deformity.       Back:  Neurological: She is alert and oriented to person, place, and time. She has normal reflexes. Abnormal reflex: Will ad,minister 60 of toradol IM.   Skin: Skin is warm.  Psychiatric: She has a normal mood and affect.  Vitals reviewed.    UC Treatments / Results  Labs (all labs ordered are listed, but only abnormal results are displayed) Labs Reviewed - No data to display  EKG  EKG Interpretation None       Radiology Dg Cervical Spine Complete  Result Date: 02/25/2016 CLINICAL DATA:  Worsening left upper neck pain for several months. Now with limited range of motion. No acute injury. EXAM: CERVICAL SPINE - COMPLETE 4+ VIEW COMPARISON:  None. FINDINGS: There is no evidence of cervical spine fracture or prevertebral soft tissue swelling. Alignment is normal. Mild anterior vertebral osteophyte formation and anterior longitudinal ligament calcification seen at C5-6, without significant disc space narrowing. No evidence of facet arthropathy or neural foraminal  stenosis. Small bilateral cervical ribs noted at C7. Mild cervical kyphosis is also seen which may be due to muscle spasm or patient positioning. IMPRESSION: No evidence of cervical spine fracture or subluxation. Mild cervical kyphosis, which may be due to muscle spasm or patient positioning ; clinical correlation recommended. Mild anterior vertebral osteophyte formation at C5-6, without disc space narrowing. Small bilateral cervical ribs at C7. Electronically Signed   By: Earle Gell M.D.   On: 02/25/2016 12:06    Procedures Procedures (including critical care time)  Medications Ordered in UC Medications  ketorolac (TORADOL) injection 60 mg (60 mg Intramuscular Given 02/25/16 1158)     Initial Impression / Assessment and Plan / UC Course  I have reviewed the triage vital signs and the nursing notes.  Pertinent labs & imaging results that were available during my care of the patient were reviewed by me and considered in my medical decision making (see chart for details).  Clinical Course  Will administer 75m of toradol IM.  x-rays C-spine does not think the patient has expiratory nerve neuritis and will discuss with her about injecting this area if she is going to do that   Final Clinical Impressions(s) / UC Diagnoses   Final diagnoses:  Neck pain  Muscle spasms of neck  Cervico-occipital neuralgia of left side   Offered to administer steroid injection to site but she declines at this time.  New Prescriptions New Prescriptions   MELOXICAM (MOBIC) 15 MG TABLET    Take 1 tablet (15 mg total) by mouth daily.   ORPHENADRINE (NORFLEX) 100 MG TABLET    Take 1 tablet (100 mg total) by mouth 2 (two) times daily.   PREDNISONE (STERAPRED UNI-PAK 21 TAB) 10 MG (21) TBPK TABLET    Sig 6 tablet day 1, 5 tablets day 2, 4 tablets day 3,,3tablets day 4, 2 tablets day 5, 1 tablet day 6 take all tablets orally  Frederich Cha, MD 02/25/16 [phone]

## 2016-02-25 NOTE — ED Triage Notes (Signed)
Patient says that there has been a nagging pain at the base of her head for about a month and on Saturday she woke up and she cant even move her head.  She says it also hurts to swallow.

## 2016-03-15 ENCOUNTER — Ambulatory Visit (INDEPENDENT_AMBULATORY_CARE_PROVIDER_SITE_OTHER): Payer: BLUE CROSS/BLUE SHIELD | Admitting: Family Medicine

## 2016-03-15 ENCOUNTER — Encounter: Payer: Self-pay | Admitting: Family Medicine

## 2016-03-15 DIAGNOSIS — M5481 Occipital neuralgia: Secondary | ICD-10-CM | POA: Insufficient documentation

## 2016-03-15 MED ORDER — PREDNISONE 10 MG (21) PO TBPK: ORAL_TABLET | ORAL | 0 refills | Status: DC

## 2016-03-15 NOTE — Progress Notes (Signed)
SUBJECTIVE:  Dana Graves is a 40 y.o. female who complains of  neck pain for 1 month duration.  The pain is positional with movement of neck without radiation of pain down the arms.   Sharp stabbing pain at base of the skull. Symptoms have been insidious since that time. Prior history of neck problems:  She's had neck problems before that she had a CT scan of her neck about 3 years ago which was negative. There is no numbness, tingling, weakness in the arms.  Went to UC on 02/25/16.  Notes and xrays reviewed.  CLINICAL DATA:  Worsening left upper neck pain for several months. Now with limited range of motion. No acute injury.  EXAM: CERVICAL SPINE - COMPLETE 4+ VIEW  COMPARISON:  None.  FINDINGS: There is no evidence of cervical spine fracture or prevertebral soft tissue swelling. Alignment is normal.  Mild anterior vertebral osteophyte formation and anterior longitudinal ligament calcification seen at C5-6, without significant disc space narrowing. No evidence of facet arthropathy or neural foraminal stenosis. Small bilateral cervical ribs noted at C7. Mild cervical kyphosis is also seen which may be due to muscle spasm or patient positioning.  IMPRESSION: No evidence of cervical spine fracture or subluxation.  Mild cervical kyphosis, which may be due to muscle spasm or patient positioning ; clinical correlation recommended.  Mild anterior vertebral osteophyte formation at C5-6, without disc space narrowing. Small bilateral cervical ribs at C7.   Given IM toradol.  Sent home with Mobic and Prednisone pack.   OBJECTIVE: BP 110/80   Pulse 67   Wt 143 lb (64.9 kg)   LMP 02/13/2016 (Within Days) Comment: denies preg  SpO2 98%   BMI 21.74 kg/m   Vital signs as noted above. Patient appears to be in mild to moderate pain.  Neck exam: normal C-spine, no tenderness, full ROM, normal neurological exam of arms; normal DTR's, motor, sensory exam.   ASSESSMENT:   Occipital neuralgia

## 2016-03-15 NOTE — Assessment & Plan Note (Signed)
Symptoms seem very consistent with occipital neuralgia. >25 minutes spent in face to face time with patient, >50% spent in counselling or coordination of care discussing tx options. She does not want to start gabapentin or muscle relaxant.  She does want to see a neurologist. Since prednisone did improve her symptoms, will repeat course of pred and refer to neurology. The patient indicates understanding of these issues and agrees with the plan.

## 2016-03-15 NOTE — Patient Instructions (Signed)
Occipital Neuralgia Occipital neuralgia is a type of headache that causes episodes of very bad pain in the back of your head. Pain from occipital neuralgia may spread (radiate) to other parts of your head. The pain is usually brief and often goes away after you rest and relax. These headaches may be caused by irritation of the nerves that leave your spinal cord high up in your neck, just below the base of your skull (occipital nerves). Your occipital nerves transmit sensations from the back of your head, the top of your head, and the areas behind your ears. CAUSES Occipital neuralgia can occur without any known cause (primary headache syndrome). In other cases, occipital neuralgia is caused by pressure on or irritation of one of the two occipital nerves. Causes of occipital nerve compression or irritation include:  Wear and tear of the vertebrae in the neck (osteoarthritis).  Neck injury.  Disease of the disks that separate the vertebrae.  Tumors.  Gout.  Infections.  Diabetes.  Swollen blood vessels that put pressure on the occipital nerves.  Muscle spasm in the neck. SIGNS AND SYMPTOMS Pain is the main symptom of occipital neuralgia. It usually starts in the back of the head but may also be felt in other areas supplied by the occipital nerves. Pain is usually on one side but may be on both sides. You may have:   Brief episodes of very bad pain that is burning, stabbing, shocking, or shooting.  Pain behind the eye.  Pain triggered by neck movement or hair brushing.  Scalp tenderness.  Aching in the back of the head between episodes of very bad pain. DIAGNOSIS  Your health care provider may diagnose occipital neuralgia based on your symptoms and a physical exam. During the exam, the health care provider may push on areas supplied by the occipital nerves to see if they are painful. Some tests may also be done to help in making the diagnosis. These may include:  Imaging studies of  the upper spinal cord, such as an MRI or CT scan. These may show compression or spinal cord abnormalities.  Nerve block. You will get an injection of numbing medicine (local anesthetic) near the occipital nerve to see if this relieves pain. TREATMENT  Treatment may begin with simple measures, such as:   Rest.  Massage.  Heat.  Over-the-counter pain relievers. If these measures do not work, you may need other treatments, including:  Medicines such as:  Prescription-strength anti-inflammatory medicines.  Muscle relaxants.  Antiseizure medicines.  Antidepressants.  Steroid injection. This involves injections of local anesthetic and strong anti-inflammatory drugs (steroids).  Pulsed radiofrequency. Wires are implanted to deliver electrical impulses that block pain signals from the occipital nerve.  Physical therapy.  Surgery to relieve nerve pressure. HOME CARE INSTRUCTIONS  Take all medicines as directed by your health care provider.  Avoid activities that cause pain.  Rest when you have an attack of pain.  Try gentle massage or a heating pad to relieve pain.  Work with a physical therapist to learn stretching exercises you can do at home.  Try a different pillow or sleeping position.  Practice good posture.  Try to stay active. Get regular exercise that does not cause pain. Ask your health care provider to suggest safe exercises for you.  Keep all follow-up visits as directed by your health care provider. This is important. SEEK MEDICAL CARE IF:  Your medicine is not working.  You have new or worsening symptoms. [place]  IF:  You have very bad head pain that is not going away.  You have a sudden change in vision, balance, or speech. MAKE SURE YOU:  Understand these instructions.  Will watch your condition.  Will get help right away if you are not doing well or get worse.   This information is not intended to replace advice given to  you by your health care provider. Make sure you discuss any questions you have with your health care provider.   Document Released: 06/01/2001 Document Revised: 06/28/2014 Document Reviewed: 05/30/2013 Elsevier Interactive Patient Education [employer].

## 2016-03-15 NOTE — Progress Notes (Signed)
Pre visit review using our clinic review tool, if applicable. No additional management support is needed unless otherwise documented below in the visit note. 

## 2016-10-07 ENCOUNTER — Ambulatory Visit (INDEPENDENT_AMBULATORY_CARE_PROVIDER_SITE_OTHER): Payer: No Typology Code available for payment source | Admitting: Family Medicine

## 2016-10-07 ENCOUNTER — Encounter: Payer: Self-pay | Admitting: Family Medicine

## 2016-10-07 VITALS — BP 102/80 | HR 88 | Temp 98.1°F | Wt 146.0 lb

## 2016-10-07 DIAGNOSIS — J302 Other seasonal allergic rhinitis: Secondary | ICD-10-CM | POA: Diagnosis not present

## 2016-10-07 DIAGNOSIS — J309 Allergic rhinitis, unspecified: Secondary | ICD-10-CM | POA: Insufficient documentation

## 2016-10-07 DIAGNOSIS — R198 Other specified symptoms and signs involving the digestive system and abdomen: Secondary | ICD-10-CM

## 2016-10-07 MED ORDER — MONTELUKAST SODIUM 10 MG PO TABS: 10.0000 mg | ORAL_TABLET | Freq: Every day | ORAL | 3 refills | Status: DC

## 2016-10-07 MED ORDER — LEVOCETIRIZINE DIHYDROCHLORIDE 5 MG PO TABS: 5.0000 mg | ORAL_TABLET | Freq: Every evening | ORAL | 3 refills | Status: DC

## 2016-10-07 MED ORDER — FLUTICASONE PROPIONATE 50 MCG/ACT NA SUSP: 2.0000 | Freq: Every day | NASAL | 6 refills | Status: DC

## 2016-10-07 NOTE — Progress Notes (Signed)
Pre visit review using our clinic review tool, if applicable. No additional management support is needed unless otherwise documented below in the visit note. 

## 2016-10-07 NOTE — Assessment & Plan Note (Signed)
Deteriorated. Dc allegra. eRx sent for xyzal and flonase. Will also send in eRx for singulair which she can add on if respiratory symptoms continue. Call or return to clinic prn if these symptoms worsen or fail to improve as anticipated.  The patient indicates understanding of these issues and agrees with the plan.

## 2016-10-07 NOTE — Assessment & Plan Note (Signed)
Exam unremarkable. Refer to GI. The patient indicates understanding of these issues and agrees with the plan.

## 2016-10-07 NOTE — Patient Instructions (Addendum)
Great to see you.  Let's start flonase daily with xyzal every evening.  Ok to start singulair if the respiratory symptoms continue.   We are referring you to a gastroenterologist and will be calling you with an appointment.

## 2016-10-07 NOTE — Progress Notes (Signed)
Subjective:   Patient ID: Dana Graves, female    DOB: 1975-06-28, 41 y.o.   MRN: [id number]  Dana Graves is a pleasant 41 y.o. year old female who presents to clinic today with Allergies (Taking Allegra and Azelatine eye drops. Still having bad symptoms: Chest congestion, slight cough, headaches, nasal congestion) and anal leakage  on 10/07/2016  HPI:   Allergic rhinitis- has h/o of seasonal allergies but symptoms have been worse this year.  She is very sensitive to pollen and has been spending more time outdoors.  Allegra and azelatine eye drops have not been very effective.  Having more cough this year as well.  Itchy/watery eyes, slight cough and headaches as well.    Anal leakage- For the past year, notices after her first BM of the day that she often has a little leakage of mucous after her BM.  She may feel something wet in her underwear and find a mucousy/brown liquid in her underwear.  Not itchy or painful.  Does not happen any other time of the day typically. NO family h/o of IBD that she is aware of. No blood in her stool. No constipation or diarrhea.   Current Outpatient Prescriptions on File Prior to Visit  Medication Sig Dispense Refill  . norgestimate-ethinyl estradiol (ORTHO-CYCLEN,SPRINTEC,PREVIFEM) 0.25-35 MG-MCG tablet Take 1 tablet by mouth daily.    . meloxicam (MOBIC) 15 MG tablet Take 1 tablet (15 mg total) by mouth daily. (Patient not taking: Reported on 10/07/2016) 30 tablet 1   No current facility-administered medications on file prior to visit.     No Known Allergies  Past Medical History:  Diagnosis Date  . Dyspareunia   . Pelvic pain in female   . Wears contact lenses     Past Surgical History:  Procedure Laterality Date  . BREAST ENHANCEMENT SURGERY  feb  2002  . LAPAROSCOPY N/A 12/13/2014   Procedure: LAPAROSCOPY DIAGNOSTIC ;  Surgeon: Marylynn Pearson, MD;  Location: [facility];  Service: Gynecology;  Laterality:  N/A;  . LAPAROSCOPY FOR ECTOPIC PREGNANCY  2009 approx.   Unilateral Salpingostomy    Family History  Problem Relation Age of Onset  . Cancer Maternal Grandmother   . Cancer Paternal Grandfather   . Sudden death Paternal Grandfather     Social History   Social History  . Marital status: Married    Spouse name: N/A  . Number of children: N/A  . Years of education: N/A   Occupational History  . Not on file.   Social History Main Topics  . Smoking status: Never Smoker  . Smokeless tobacco: Never Used  . Alcohol use 4.2 oz/week    7 Glasses of wine per week     Comment: 1-2 DAILY WINE  . Drug use: No  . Sexual activity: Yes    Birth control/ protection: Pill   Other Topics Concern  . Not on file   Social History Narrative  . No narrative on file   The PMH, PSH, Social History, Family History, Medications, and allergies have been reviewed in [facility], and have been updated if relevant.  Review of Systems  Constitutional: Negative.   HENT: Positive for congestion, rhinorrhea and sinus pressure. Negative for ear discharge, ear pain, sinus pain, tinnitus, trouble swallowing and voice change.   Eyes: Positive for redness and itching. Negative for photophobia and visual disturbance.  Respiratory: Positive for cough. Negative for apnea, choking, chest tightness, shortness of breath, wheezing and stridor.  Gastrointestinal: Negative for abdominal distention, abdominal pain, anal bleeding, blood in stool, constipation, diarrhea, nausea, rectal pain and vomiting.       Mucous leakage from rectum  All other systems reviewed and are negative.      Objective:    BP 102/80 (BP Location: Left Arm, Patient Position: Sitting, Cuff Size: Normal)   Pulse 88   Temp 98.1 F (36.7 C) (Oral)   Wt 146 lb (66.2 kg)   SpO2 97%   BMI 22.20 kg/m    Physical Exam  Constitutional: She is oriented to person, place, and time. She appears well-developed and well-nourished.  HENT:  Head:  Normocephalic and atraumatic.  Right Ear: Hearing and tympanic membrane normal.  Left Ear: Hearing and tympanic membrane normal.  Nose: Rhinorrhea present. Right sinus exhibits no maxillary sinus tenderness and no frontal sinus tenderness. Left sinus exhibits no maxillary sinus tenderness and no frontal sinus tenderness.  Cardiovascular: Normal rate and regular rhythm.   Pulmonary/Chest: Effort normal and breath sounds normal. No respiratory distress. She has no wheezes. She has no rales.  Genitourinary: Rectum normal. Rectal exam shows no external hemorrhoid, no internal hemorrhoid, no fissure, no mass, no tenderness, anal tone normal and guaiac negative stool.  Neurological: She is alert and oriented to person, place, and time. No cranial nerve deficit.  Skin: Skin is warm and dry. She is not diaphoretic.  Psychiatric: She has a normal mood and affect. Her behavior is normal. Judgment and thought content normal.  Nursing note and vitals reviewed.         Assessment & Plan:   Seasonal allergic rhinitis, unspecified trigger  Anal symptoms - Plan: Ambulatory referral to Gastroenterology No Follow-up on file.

## 2016-10-13 ENCOUNTER — Encounter: Payer: Self-pay | Admitting: Gastroenterology

## 2016-11-11 ENCOUNTER — Ambulatory Visit (INDEPENDENT_AMBULATORY_CARE_PROVIDER_SITE_OTHER): Payer: No Typology Code available for payment source | Admitting: Gastroenterology

## 2016-11-11 ENCOUNTER — Encounter: Payer: Self-pay | Admitting: Gastroenterology

## 2016-11-11 ENCOUNTER — Other Ambulatory Visit (INDEPENDENT_AMBULATORY_CARE_PROVIDER_SITE_OTHER): Payer: No Typology Code available for payment source

## 2016-11-11 VITALS — BP 100/70 | HR 84 | Ht 67.5 in | Wt 146.1 lb

## 2016-11-11 DIAGNOSIS — R151 Fecal smearing: Secondary | ICD-10-CM

## 2016-11-11 DIAGNOSIS — R140 Abdominal distension (gaseous): Secondary | ICD-10-CM

## 2016-11-11 LAB — IGA: IgA: 153 mg/dL (ref 68–378)

## 2016-11-11 NOTE — Progress Notes (Signed)
[facility] Consult Note:  History: Dana Graves 11/11/2016  Referring physician: Lucille Passy, MD  Reason for consult/chief complaint: rectal leakage (jelly like substance mostly in the mornings); fecal frequency; Diarrhea (loose stools for the last couple of days); Nausea; and Bloated   Subjective  HPI:  Dana Graves was referred by primary care noted above for some digestive symptoms. She reports that for many years she has had "stomach troubles". This largely amounts to chronic bloating and gas with some intermittent postprandial abdominal distention. She tends to have a few BMs per day that are usually formed but sometimes loose. She is not always sure what the trigger may be. Sometimes it seems to be certain foods or caffeine, but not consistently. There's been no rectal bleeding. She feels certain that the GI symptoms are worse when she has the sensation of she knows to be a cyst in the left side of the pelvis. She says this was discovered on previous workups and from a laparoscopy. She is also bothered by the passage of a "jellylike substance" that will often occur after the first or second morning BM.   ROS:  Review of Systems  Constitutional: Negative for appetite change and unexpected weight change.  HENT: Negative for mouth sores and voice change.   Eyes: Negative for pain and redness.  Respiratory: Negative for cough and shortness of breath.   Cardiovascular: Negative for chest pain and palpitations.  Genitourinary: Negative for dysuria and hematuria.  Musculoskeletal: Positive for back pain. Negative for arthralgias and myalgias.  Skin: Negative for pallor and rash.  Allergic/Immunologic: Positive for environmental allergies.  Neurological: Negative for weakness and headaches.  Hematological: Negative for adenopathy.     Past Medical History: Past Medical History:  Diagnosis Date  . Dyspareunia   . Pelvic pain in female   . Seasonal  allergies   . Wears contact lenses      Past Surgical History: Past Surgical History:  Procedure Laterality Date  . BREAST ENHANCEMENT SURGERY  feb  2002  . LAPAROSCOPY N/A 12/13/2014   Procedure: LAPAROSCOPY DIAGNOSTIC ;  Surgeon: Marylynn Pearson, MD;  Location: [facility];  Service: Gynecology;  Laterality: N/A;  . LAPAROSCOPY FOR ECTOPIC PREGNANCY  2009 approx.   Unilateral Salpingostomy   She had 2 children via SVD, and reports having had a severe perineal tear with the first one.  Family History: Family History  Problem Relation Age of Onset  . Breast cancer Maternal Grandmother   . Colon cancer Paternal Grandfather   . Diabetes Mother   . Colon polyps Father   . Heart attack Maternal Grandfather   no IBD   Social History: Social History   Social History  . Marital status: Married    Spouse name: N/A  . Number of children: 2  . Years of education: N/A   Occupational History  . work from home    Social History Main Topics  . Smoking status: Never Smoker  . Smokeless tobacco: Never Used  . Alcohol use 4.2 oz/week    7 Glasses of wine per week     Comment: 1-2 DAILY WINE  . Drug use: No  . Sexual activity: Yes    Birth control/ protection: Pill   Other Topics Concern  . None   Social History Narrative  . 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 Allergies: No Known Allergies  Outpatient Meds: Current Outpatient  Prescriptions  Medication Sig Dispense Refill  . azelastine (OPTIVAR) 0.05 % ophthalmic solution Apply 1 drop to eye 2 (two) times daily.  0  . fluticasone (FLONASE) 50 MCG/ACT nasal spray Place 2 sprays into both nostrils daily. 16 g 6  . levocetirizine (XYZAL) 5 MG tablet Take 1 tablet (5 mg total) by mouth every evening. 30 tablet 3  . montelukast (SINGULAIR) 10 MG tablet Take 1 tablet (10 mg total) by mouth at bedtime. 30 tablet 3  .  norgestimate-ethinyl estradiol (ORTHO-CYCLEN,SPRINTEC,PREVIFEM) 0.25-35 MG-MCG tablet Take 1 tablet by mouth daily.    . meloxicam (MOBIC) 15 MG tablet Take 1 tablet (15 mg total) by mouth daily. (Patient not taking: Reported on 10/07/2016) 30 tablet 1   No current facility-administered medications for this visit.       ___________________________________________________________________ Objective   Exam:  BP 100/70 (BP Location: Left Arm, Patient Position: Sitting, Cuff Size: Normal)   Pulse 84   Ht 5' 7.5" (1.715 m) Comment: height measured without shoes  Wt 146 lb 2 oz (66.3 kg)   LMP 10/28/2016   BMI 22.55 kg/m    General: this is a(n) Well-appearing woman   Eyes: sclera anicteric, no redness  ENT: oral mucosa moist without lesions, no cervical or supraclavicular lymphadenopathy, good dentition  CV: RRR without murmur, S1/S2, no JVD, no peripheral edema  Resp: clear to auscultation bilaterally, normal RR and effort noted  GI: soft, no tenderness, with active bowel sounds. No guarding or palpable organomegaly noted.  Skin; warm and dry, no rash or jaundice noted  Neuro: awake, alert and oriented x 3. Normal gross motor function and fluent speech Rectal:( chaperoned by our MA Toni): No external abnormalities. Slightly decreased resting sphincter tone, normal voluntary tone, no palpable internal lesions. This was somewhat limited by the patient's tolerance of the exam. No anal tenderness or fissure was felt. Labs:  CMP Latest Ref Rng & Units 02/12/2015  Glucose 70 - 99 mg/dL 89  BUN 6 - 23 mg/dL 19  Creatinine 0.40 - 1.20 mg/dL 0.78  Sodium 135 - 145 mEq/L 138  Potassium 3.5 - 5.1 mEq/L 4.7  Chloride 96 - 112 mEq/L 104  CO2 19 - 32 mEq/L 27  Calcium 8.4 - 10.5 mg/dL 9.5  Total Protein 6.0 - 8.3 g/dL 7.5  Total Bilirubin 0.2 - 1.2 mg/dL 0.4  Alkaline Phos 39 - 117 U/L 46  AST 0 - 37 U/L 24  ALT 0 - 35 U/L 13   CBC Latest Ref Rng & Units 02/12/2015 12/13/2014  11/04/2009  WBC 4.0 - 10.5 K/uL 7.6 5.1 13.5(H)  Hemoglobin 12.0 - 15.0 g/dL 14.3 13.7 12.0  Hematocrit 36.0 - 46.0 % 41.6 40.8 34.4(L)  Platelets 150.0 - 400.0 K/uL 230.0 180 139(L)     Assessment: Encounter Diagnoses  Name Primary?  . Abdominal bloating Yes  . Fecal smearing     She has classic IBS symptoms and the passage of some mucus, which occurs commonly with this condition. IBD would be much less likely.  Plan:  I gave her some written dietary advice of common triggers. She is sometimes felt she may be lactose intolerant.  TTG antibody to rule out celiac sprue She did not want to try any medicines. She will wait for the lab results and if she changes her mind, she may undergo a trial of as needed hyoscyamine. Otherwise she will see me as needed.  Thank you for the courtesy of this consult.  Please call me with  any questions or concerns.  Nelida Meuse III  CC: Lucille Passy, MD

## 2016-11-11 NOTE — Patient Instructions (Addendum)
If you are age 43 or older, your body mass index should be between 23-30. Your Body mass index is 22.55 kg/m. If this is out of the aforementioned range listed, please consider follow up with your Primary Care Provider.  If you are age 23 or younger, your body mass index should be between 19-25. Your Body mass index is 22.55 kg/m. If this is out of the aformentioned range listed, please consider follow up with your Primary Care Provider.   Your physician has requested that you go to the basement for lab work before leaving today.   Food Guidelines for gas/bloating/diarrhea:  Many people have difficulty digesting certain foods, causing a variety of distressing and embarrassing symptoms such as abdominal pain, bloating and gas.  These foods may need to be avoided or consumed in small amounts.  Here are some tips that might be helpful for you.  1.   Lactose intolerance is the difficulty or complete inability to digest lactose, the natural sugar in milk and anything made from milk.  This condition is harmless, common, and can begin any time during life.  Some people can digest a modest amount of lactose while others cannot tolerate any.  Also, not all dairy products contain equal amounts of lactose.  For example, hard cheeses such as parmesan have less lactose than soft cheeses such as cheddar.  Yogurt has less lactose than milk or cheese.  Many packaged foods (even many brands of bread) have milk, so read ingredient lists carefully.  It is difficult to test for lactose intolerance, so just try avoiding lactose as much as possible for a week and see what happens with your symptoms.  If you seem to be lactose intolerant, the best plan is to avoid it (but make sure you get calcium from another source).  The next best thing is to use lactase enzyme supplements, available over the counter everywhere.  Just know that many lactose intolerant people need to take several tablets with each serving of dairy to avoid  symptoms.  Lastly, a lot of restaurant food is made with milk or butter.  Many are things you might not suspect, such as mashed potatoes, rice and pasta (cooked with butter) and "grilled" items.  If you are lactose intolerant, it never hurts to ask your server what has milk or butter.  2.   Fiber is an important part of your diet, but not all fiber is well-tolerated.  Insoluble fiber such as bran is often consumed by normal gut bacteria and converted into gas.  Soluble fiber such as oats, squash, carrots and green beans are typically tolerated better.  3.   Some types of carbohydrates can be poorly digested.  Examples include: fructose (apples, cherries, pears, raisins and other dried fruits), fructans (onions, zucchini, large amounts of wheat), sorbitol/mannitol/xylitol and sucralose/Splenda (common artificial sweeteners), and raffinose (lentils, broccoli, cabbage, asparagus, brussel sprouts, many types of beans).  Do a Development worker, community for [employer] and you will find helpful information. Beano, a dietary supplement, will often help with raffinose-containing foods.  As with lactase tablets, you may need several per serving.  4.   Whenever possible, avoid processed food&meats and chemical additives.  High fructose corn syrup, a common sweetener, may be difficult to digest.  Eggs and soy (comes from the soybean, and added to many foods now) are the other most common bloating/gassy foods.  - Dr. [facility]

## 2016-11-12 LAB — TISSUE TRANSGLUTAMINASE, IGA: Tissue Transglutaminase Ab, IgA: 1 U/mL (ref <4)

## 2016-12-16 ENCOUNTER — Other Ambulatory Visit: Payer: Self-pay | Admitting: Obstetrics and Gynecology

## 2016-12-16 DIAGNOSIS — R928 Other abnormal and inconclusive findings on diagnostic imaging of breast: Secondary | ICD-10-CM

## 2016-12-21 ENCOUNTER — Ambulatory Visit
Admission: RE | Admit: 2016-12-21 | Discharge: 2016-12-21 | Disposition: A | Discharge status: Home / Self Care | Payer: No Typology Code available for payment source | Source: Ambulatory Visit | Attending: Obstetrics and Gynecology | Admitting: Obstetrics and Gynecology

## 2016-12-21 DIAGNOSIS — R928 Other abnormal and inconclusive findings on diagnostic imaging of breast: Secondary | ICD-10-CM

## 2017-09-05 ENCOUNTER — Telehealth: Payer: No Typology Code available for payment source | Admitting: Family

## 2017-09-05 DIAGNOSIS — J028 Acute pharyngitis due to other specified organisms: Secondary | ICD-10-CM

## 2017-09-05 DIAGNOSIS — B9689 Other specified bacterial agents as the cause of diseases classified elsewhere: Secondary | ICD-10-CM

## 2017-09-05 MED ORDER — BENZONATATE 100 MG PO CAPS: 100.0000 mg | ORAL_CAPSULE | Freq: Three times a day (TID) | ORAL | 0 refills | Status: DC | PRN

## 2017-09-05 MED ORDER — AZITHROMYCIN 250 MG PO TABS: ORAL_TABLET | ORAL | 0 refills | Status: DC

## 2017-09-05 MED ORDER — PREDNISONE 5 MG PO TABS
5.0000 mg | ORAL_TABLET | ORAL | 0 refills | Status: DC
Start: 2017-09-05 — End: 2019-03-05

## 2017-09-05 NOTE — Progress Notes (Signed)
Thank you for the details you included in the comment boxes. Those details are very helpful in determining the best course of treatment for you and help [place] to provide the best care.  We are sorry that you are not feeling well.  Here is how we plan to help!  Based on your presentation I believe you most likely have A cough due to bacteria.  When patients have a fever and a productive cough with a change in color or increased sputum production, we are concerned about bacterial bronchitis.  If left untreated it can progress to pneumonia.  If your symptoms do not improve with your treatment plan it is important that you contact your provider.   I have prescribed Azithromyin 250 mg: two tablets now and then one tablet daily for 4 additonal days    In addition you may use A non-prescription cough medication called Mucinex DM: take 2 tablets every 12 hours. and A prescription cough medication called Tessalon Perles 161m. You may take 1-2 capsules every 8 hours as needed for your cough.  Sterapred 5 mg dosepak  From your responses in the eVisit questionnaire you describe inflammation in the upper respiratory tract which is causing a significant cough.  This is commonly called Bronchitis and has four common causes:    Allergies  Viral Infections  Acid Reflux  Bacterial Infection Allergies, viruses and acid reflux are treated by controlling symptoms or eliminating the cause. An example might be a cough caused by taking certain blood pressure medications. You stop the cough by changing the medication. Another example might be a cough caused by acid reflux. Controlling the reflux helps control the cough.  USE OF BRONCHODILATOR ("RESCUE") INHALERS: There is a risk from using your bronchodilator too frequently.  The risk is that over-reliance on a medication which only relaxes the muscles surrounding the breathing tubes can reduce the effectiveness of medications prescribed to reduce swelling and congestion  of the tubes themselves.  Although you feel brief relief from the bronchodilator inhaler, your asthma may actually be worsening with the tubes becoming more swollen and filled with mucus.  This can delay other crucial treatments, such as oral steroid medications. If you need to use a bronchodilator inhaler daily, several times per day, you should discuss this with your provider.  There are probably better treatments that could be used to keep your asthma under control.     HOME CARE . Only take medications as instructed by your medical team. . Complete the entire course of an antibiotic. . Drink plenty of fluids and get plenty of rest. . Avoid close contacts especially the very young and the elderly . Cover your mouth if you cough or cough into your sleeve. . Always remember to wash your hands . A steam or ultrasonic humidifier can help congestion.   GET HELP RIGHT AWAY IF: . You develop worsening fever. . You become short of breath . You cough up blood. . Your symptoms persist after you have completed your treatment plan MAKE SURE YOU   Understand these instructions.  Will watch your condition.  Will get help right away if you are not doing well or get worse.  Your e-visit answers were reviewed by a board certified advanced clinical practitioner to complete your personal care plan.  Depending on the condition, your plan could have included both over the counter or prescription medications. If there is a problem please reply  once you have received a response from your provider. Your  safety is important to [place].  If you have drug allergies check your prescription carefully.    You can use MyChart to ask questions about today's visit, request a non-urgent call back, or ask for a work or school excuse for 24 hours related to this e-Visit. If it has been greater than 24 hours you will need to follow up with your provider, or enter a new e-Visit to address those concerns. You will get an  e-mail in the next two days asking about your experience.  I hope that your e-visit has been valuable and will speed your recovery. Thank you for using e-visits.

## 2017-11-06 ENCOUNTER — Telehealth: Payer: No Typology Code available for payment source | Admitting: Physician Assistant

## 2017-11-06 DIAGNOSIS — R300 Dysuria: Secondary | ICD-10-CM

## 2017-11-06 MED ORDER — CEPHALEXIN 500 MG PO CAPS: 500.0000 mg | ORAL_CAPSULE | Freq: Two times a day (BID) | ORAL | 0 refills | Status: AC

## 2017-11-06 NOTE — Progress Notes (Signed)
We are sorry that you are not feeling well.  Here is how we plan to help!  Based on what you shared with me it looks like you most likely have a simple urinary tract infection.  A UTI (Urinary Tract Infection) is a bacterial infection of the bladder.  Most cases of urinary tract infections are simple to treat but a key part of your care is to encourage you to drink plenty of fluids and watch your symptoms carefully.  I have prescribed Keflex 500 mg twice a day for 7 days.  Your symptoms should gradually improve. Call us if the burning in your urine worsens, you develop worsening fever, back pain or pelvic pain or if your symptoms do not resolve after completing the antibiotic.  Urinary tract infections can be prevented by drinking plenty of water to keep your body hydrated.  Also be sure when you wipe, wipe from front to back and don't hold it in!  If possible, empty your bladder every 4 hours.  Your e-visit answers were reviewed by a board certified advanced clinical practitioner to complete your personal care plan.  Depending on the condition, your plan could have included both over the counter or prescription medications.  If there is a problem please reply  once you have received a response from your provider.  Your safety is important to [place].  If you have drug allergies check your prescription carefully.    You can use MyChart to ask questions about today's visit, request a non-urgent call back, or ask for a work or school excuse for 24 hours related to this e-Visit. If it has been greater than 24 hours you will need to follow up with your provider, or enter a new e-Visit to address those concerns.   You will get an e-mail in the next two days asking about your experience.  I hope that your e-visit has been valuable and will speed your recovery. Thank you for using e-visits.

## 2018-08-01 ENCOUNTER — Telehealth: Payer: No Typology Code available for payment source | Admitting: Physician Assistant

## 2018-08-01 DIAGNOSIS — B9689 Other specified bacterial agents as the cause of diseases classified elsewhere: Secondary | ICD-10-CM | POA: Diagnosis not present

## 2018-08-01 DIAGNOSIS — J208 Acute bronchitis due to other specified organisms: Secondary | ICD-10-CM | POA: Diagnosis not present

## 2018-08-01 MED ORDER — AZITHROMYCIN 250 MG PO TABS: ORAL_TABLET | ORAL | 0 refills | Status: AC

## 2018-08-01 NOTE — Progress Notes (Signed)
We are sorry that you are not feeling well.  Here is how we plan to help!  Based on your presentation I believe you most likely have A cough due to bacteria.  When patients have a fever and a productive cough with a change in color or increased sputum production, we are concerned about bacterial bronchitis.  If left untreated it can progress to pneumonia.  If your symptoms do not improve with your treatment plan it is important that you contact your provider.   I have prescribed Azithromyin 250 mg: two tablets now and then one tablet daily for 4 additonal days    In addition you may use A non-prescription cough medication called Robitussin DAC. Take 2 teaspoons every 8 hours or Delsym: take 2 teaspoons every 12 hours.    From your responses in the eVisit questionnaire you describe inflammation in the upper respiratory tract which is causing a significant cough.  This is commonly called Bronchitis and has four common causes:    Allergies  Viral Infections  Acid Reflux  Bacterial Infection Allergies, viruses and acid reflux are treated by controlling symptoms or eliminating the cause. An example might be a cough caused by taking certain blood pressure medications. You stop the cough by changing the medication. Another example might be a cough caused by acid reflux. Controlling the reflux helps control the cough.  USE OF BRONCHODILATOR ("RESCUE") INHALERS: There is a risk from using your bronchodilator too frequently.  The risk is that over-reliance on a medication which only relaxes the muscles surrounding the breathing tubes can reduce the effectiveness of medications prescribed to reduce swelling and congestion of the tubes themselves.  Although you feel brief relief from the bronchodilator inhaler, your asthma may actually be worsening with the tubes becoming more swollen and filled with mucus.  This can delay other crucial treatments, such as oral steroid medications. If you need to use a  bronchodilator inhaler daily, several times per day, you should discuss this with your provider.  There are probably better treatments that could be used to keep your asthma under control.     HOME CARE . Only take medications as instructed by your medical team. . Complete the entire course of an antibiotic. . Drink plenty of fluids and get plenty of rest. . Avoid close contacts especially the very young and the elderly . Cover your mouth if you cough or cough into your sleeve. . Always remember to wash your hands . A steam or ultrasonic humidifier can help congestion.   GET HELP RIGHT AWAY IF: . You develop worsening fever. . You become short of breath . You cough up blood. . Your symptoms persist after you have completed your treatment plan MAKE SURE YOU   Understand these instructions.  Will watch your condition.  Will get help right away if you are not doing well or get worse.  Your e-visit answers were reviewed by a board certified advanced clinical practitioner to complete your personal care plan.  Depending on the condition, your plan could have included both over the counter or prescription medications. If there is a problem please reply  once you have received a response from your provider. Your safety is important to [place].  If you have drug allergies check your prescription carefully.    You can use MyChart to ask questions about today's visit, request a non-urgent call back, or ask for a work or school excuse for 24 hours related to this e-Visit. If it has  been greater than 24 hours you will need to follow up with your provider, or enter a new e-Visit to address those concerns. You will get an e-mail in the next two days asking about your experience.  I hope that your e-visit has been valuable and will speed your recovery. Thank you for using e-visits.  A total of 5-10 minutes was spent evaluating this patients questionnaire and formulating a plan of care.

## 2019-01-24 ENCOUNTER — Telehealth: Payer: Self-pay

## 2019-01-24 NOTE — Telephone Encounter (Signed)
NOTES FROM DR Marylynn Pearson [phone] SENT REFERRAL TO SCHEDULING

## 2019-03-05 ENCOUNTER — Ambulatory Visit (INDEPENDENT_AMBULATORY_CARE_PROVIDER_SITE_OTHER): Payer: No Typology Code available for payment source | Admitting: Cardiology

## 2019-03-05 ENCOUNTER — Other Ambulatory Visit: Payer: Self-pay

## 2019-03-05 ENCOUNTER — Ambulatory Visit (INDEPENDENT_AMBULATORY_CARE_PROVIDER_SITE_OTHER): Payer: No Typology Code available for payment source

## 2019-03-05 ENCOUNTER — Encounter: Payer: Self-pay | Admitting: Cardiology

## 2019-03-05 VITALS — BP 110/80 | HR 73 | Temp 97.3°F | Ht 67.5 in | Wt 147.5 lb

## 2019-03-05 DIAGNOSIS — I493 Ventricular premature depolarization: Secondary | ICD-10-CM | POA: Diagnosis not present

## 2019-03-05 NOTE — Patient Instructions (Addendum)
Medication Instructions:  -  Your physician recommends that you continue on your current medications as directed. Please refer to the Current Medication list given to you today.  If you need a refill on your cardiac medications before your next appointment, please call your pharmacy.   Lab work: - none ordered  If you have labs (blood work) drawn today and your tests are completely normal, you will receive your results only by: Marland Kitchen MyChart Message (if you have MyChart) OR . A paper copy in the mail If you have any lab test that is abnormal or we need to change your treatment, we will call you to review the results.  Testing/Procedures: - Your physician has requested that you have an echocardiogram. Echocardiography is a painless test that uses sound waves to create images of your heart. It provides your doctor with information about the size and shape of your heart and how well your heart's chambers and valves are working. This procedure takes approximately one hour. There are no restrictions for this procedure.  - Your physician has recommended that you wear a Zio monitor- 4 weeks total (a 2nd monitor will be mailed to you). This monitor is a medical device that records the heart's electrical activity. Doctors most often use these monitors to diagnose arrhythmias. Arrhythmias are problems with the speed or rhythm of the heartbeat. The monitor is a small device applied to your chest. You can wear one while you do your normal daily activities. While wearing this monitor if you have any symptoms to push the button and record what you felt. Once you have worn this monitor for the period of time provider prescribed (Usually 14 days), you will return the monitor device in the postage paid box. Once it is returned they will download the data collected and provide [place] with a report which the provider will then review and we will call you with those results. Important tips:  1. Avoid showering during the  first 24 hours of wearing the monitor. 2. Avoid excessive sweating to help maximize wear time. 3. Do not submerge the device, no hot tubs, and no swimming pools. 4. Keep any lotions or oils away from the patch. 5. After 24 hours you may shower with the patch on. Take brief showers with your back facing the shower head.  6. Do not remove patch once it has been placed because that will interrupt data and decrease adhesive wear time. 7. Push the button when you have any symptoms and write down what you were feeling. 8. Once you have completed wearing your monitor, remove and place into box which has postage paid and place in your outgoing mailbox.  9. If for some reason you have misplaced your box then call our office and we can provide another box and/or mail it off for you.      ID #: [id number]  Follow-Up: At [facility], you and your health needs are our priority.  As part of our continuing mission to provide you with exceptional heart care, we have created designated Provider Care Teams.  These Care Teams include your primary Cardiologist (physician) and Advanced Practice Providers (APPs -  Physician Assistants and Nurse Practitioners) who all work together to provide you with the care you need, when you need it. . in 6 weeks with Dr. Garen Lah  Any Other Special Instructions Will Be Listed Below (If Applicable). - N/A

## 2019-03-05 NOTE — Progress Notes (Addendum)
Cardiology Office Note:    Date:  03/05/2019   ID:  LULIA SCHRINER, DOB [birth year], MRN [id number]  PCP:  Lucille Passy, MD  Cardiologist:  Kate Sable, MD  Electrophysiologist:  None   Referring MD: Marylynn Pearson, MD   Chief Complaint  Patient presents with  . office visit    palpitations    History of Present Illness:    Dana Graves is a 43 y.o. female with a hx of dyspareunia, seasonal allergies, who presents for palpitations that started about 3 months ago.  She states having fluttering in her chest originally when she was working out.  The symptoms persisted throughout the day, lasting about couple of minutes and then stopping for a few seconds and then restarted again.  Over the last 3 months she said the symptoms have somewhat spaced out and have not caught for the past 2 weeks.  She drinks about 2 cups of coffee a day.  She denies drinking caffeinated soda or tea.  She does not take any medications apart from birth control pills.  She denies any history of prior MI or any cardiac disease in her family.  She denies headaches, syncope, nausea, vomiting, shortness of breath, diarrhea, hematuria, swelling in the legs, or chest pain.  Past Medical History:  Diagnosis Date  . Dyspareunia   . Pelvic pain in female   . Seasonal allergies   . Wears contact lenses     Past Surgical History:  Procedure Laterality Date  . BREAST ENHANCEMENT SURGERY  feb  2002  . LAPAROSCOPY N/A 12/13/2014   Procedure: LAPAROSCOPY DIAGNOSTIC ;  Surgeon: Marylynn Pearson, MD;  Location: [facility];  Service: Gynecology;  Laterality: N/A;  . LAPAROSCOPY FOR ECTOPIC PREGNANCY  2009 approx.   Unilateral Salpingostomy    Current Medications: Current Meds  Medication Sig  . norgestimate-ethinyl estradiol (ORTHO-CYCLEN,SPRINTEC,PREVIFEM) 0.25-35 MG-MCG tablet Take 1 tablet by mouth daily.     Allergies:   Patient has no known allergies.   Social History    Socioeconomic History  . Marital status: Married    Spouse name: Not on file  . Number of children: 2  . Years of education: Not on file  . Highest education level: Not on file  Occupational History  . Occupation: work from home  Social Needs  . Financial resource strain: Not on file  . Food insecurity    Worry: Not on file    Inability: Not on file  . Transportation needs    Medical: Not on file    Non-medical: Not on file  Tobacco Use  . Smoking status: Never Smoker  . Smokeless tobacco: Never Used  Substance and Sexual Activity  . Alcohol use: Yes    Alcohol/week: 7.0 standard drinks    Types: 7 Glasses of wine per week    Comment: 1-2 DAILY WINE  . Drug use: No  . Sexual activity: Yes    Birth control/protection: Pill  Lifestyle  . Physical activity    Days per week: Not on file    Minutes per session: Not on file  . Stress: Not on file  Relationships  . Social Herbalist on phone: Not on file    Gets together: Not on file    Attends religious service: Not on file    Active member of club or organization: Not on file    Attends meetings of clubs or organizations: Not on file    Relationship status:  Not on file  Other Topics Concern  . Not on file  Social History Narrative  . Not on file     Family History: The patient's family history includes Breast cancer in her maternal grandmother; Colon cancer in her paternal grandfather; Colon polyps in her father; Diabetes in her mother; Heart attack in her maternal grandfather.  ROS:   Please see the history of present illness.     All other systems reviewed and are negative.  EKGs/Labs/Other Studies Reviewed:    The following studies were reviewed today:   EKG:  EKG is  ordered today.  The ekg ordered today demonstrates normal sinus rhythm, frequent PVCs in a trigeminy pattern. Short pr interval, wpw pattern.  Possible old inferior MI.  Recent Labs: No results found for requested labs within last  8760 hours.  Recent Lipid Panel    Component Value Date/Time   CHOL 164 02/12/2015 1131   TRIG 163.0 (H) 02/12/2015 1131   HDL 58.20 02/12/2015 1131   CHOLHDL 3 02/12/2015 1131   VLDL 32.6 02/12/2015 1131   LDLCALC 73 02/12/2015 1131    Physical Exam:    VS:  BP 110/80 (BP Location: Right Arm, Patient Position: Sitting, Cuff Size: Normal)   Pulse 73   Temp (!) 97.3 F (36.3 C)   Ht 5' 7.5" (1.715 m)   Wt 147 lb 8 oz (66.9 kg)   SpO2 98%   BMI 22.76 kg/m     Wt Readings from Last 3 Encounters:  03/05/19 147 lb 8 oz (66.9 kg)  11/11/16 146 lb 2 oz (66.3 kg)  10/07/16 146 lb (66.2 kg)     GEN:  Well nourished, well developed in no acute distress HEENT: Normal NECK: No JVD; No carotid bruits LYMPHATICS: No lymphadenopathy CARDIAC: Regular irregular, no murmurs, rubs, gallops RESPIRATORY:  Clear to auscultation without rales, wheezing or rhonchi  ABDOMEN: Soft, non-tender, non-distended MUSCULOSKELETAL:  No edema; No deformity  SKIN: Warm and dry NEUROLOGIC:  Alert and oriented x 3 PSYCHIATRIC:  Normal affect   ASSESSMENT:    1. PVC's (premature ventricular contractions)   ECG shows frequent PVCs, in a trigeminy pattern.  Patient complains of palpitations and also episodes of fluttering in her chest so we cannot fully know if there was an episode of nonsustained VT or other arrhythmias. PLAN:    In order of problems listed above:  1. We will obtain echocardiogram to evaluate ejection fraction.  We will also get a ZIO patch for 4 weeks.  Advised patient to cut down on caffeine.  We discussed about adding a beta-blocker but patient would like to wait on adding a medication at this time and would like to wait for the results of the above tests which I think is reasonable.  If she has persistent symptoms over the next several weeks, or echocardiogram shows low EF, or ZIO Patch shows a malignant arrhythmia, then there will be a clear indication for beta-blocker and/or further  intervention.  Addendum Review of ekg revealed wpw pattern for short pr interval. Will review holter and recommend EP consult. Medication Adjustments/Labs and Tests Ordered: Current medicines are reviewed at length with the patient today.  Concerns regarding medicines are outlined above.  Orders Placed This Encounter  Procedures  . LONG TERM MONITOR (3-14 DAYS)  . EKG 12-Lead  . ECHOCARDIOGRAM COMPLETE   No orders of the defined types were placed in this encounter.   Patient Instructions  Medication Instructions:  -  Your physician recommends  that you continue on your current medications as directed. Please refer to the Current Medication list given to you today.  If you need a refill on your cardiac medications before your next appointment, please call your pharmacy.   Lab work: - none ordered  If you have labs (blood work) drawn today and your tests are completely normal, you will receive your results only by: Marland Kitchen MyChart Message (if you have MyChart) OR . A paper copy in the mail If you have any lab test that is abnormal or we need to change your treatment, we will call you to review the results.  Testing/Procedures: - Your physician has requested that you have an echocardiogram. Echocardiography is a painless test that uses sound waves to create images of your heart. It provides your doctor with information about the size and shape of your heart and how well your heart's chambers and valves are working. This procedure takes approximately one hour. There are no restrictions for this procedure.  - Your physician has recommended that you wear a Zio monitor- 4 weeks total (a 2nd monitor will be mailed to you). This monitor is a medical device that records the heart's electrical activity. Doctors most often use these monitors to diagnose arrhythmias. Arrhythmias are problems with the speed or rhythm of the heartbeat. The monitor is a small device applied to your chest. You can wear one  while you do your normal daily activities. While wearing this monitor if you have any symptoms to push the button and record what you felt. Once you have worn this monitor for the period of time provider prescribed (Usually 14 days), you will return the monitor device in the postage paid box. Once it is returned they will download the data collected and provide [place] with a report which the provider will then review and we will call you with those results. Important tips:  1. Avoid showering during the first 24 hours of wearing the monitor. 2. Avoid excessive sweating to help maximize wear time. 3. Do not submerge the device, no hot tubs, and no swimming pools. 4. Keep any lotions or oils away from the patch. 5. After 24 hours you may shower with the patch on. Take brief showers with your back facing the shower head.  6. Do not remove patch once it has been placed because that will interrupt data and decrease adhesive wear time. 7. Push the button when you have any symptoms and write down what you were feeling. 8. Once you have completed wearing your monitor, remove and place into box which has postage paid and place in your outgoing mailbox.  9. If for some reason you have misplaced your box then call our office and we can provide another box and/or mail it off for you.        Follow-Up: At [facility], you and your health needs are our priority.  As part of our continuing mission to provide you with exceptional heart care, we have created designated Provider Care Teams.  These Care Teams include your primary Cardiologist (physician) and Advanced Practice Providers (APPs -  Physician Assistants and Nurse Practitioners) who all work together to provide you with the care you need, when you need it. . in 6 weeks with Dr. Garen Lah  Any Other Special Instructions Will Be Listed Below (If Applicable). - N/A      Signed, Kate Sable, MD  03/05/2019 8:37 AM    [facility]

## 2019-03-17 ENCOUNTER — Ambulatory Visit (INDEPENDENT_AMBULATORY_CARE_PROVIDER_SITE_OTHER): Payer: No Typology Code available for payment source

## 2019-03-17 DIAGNOSIS — I493 Ventricular premature depolarization: Secondary | ICD-10-CM

## 2019-03-30 ENCOUNTER — Ambulatory Visit (INDEPENDENT_AMBULATORY_CARE_PROVIDER_SITE_OTHER): Payer: No Typology Code available for payment source

## 2019-03-30 ENCOUNTER — Other Ambulatory Visit: Payer: Self-pay

## 2019-03-30 DIAGNOSIS — I493 Ventricular premature depolarization: Secondary | ICD-10-CM

## 2019-04-09 ENCOUNTER — Telehealth: Payer: Self-pay | Admitting: *Deleted

## 2019-04-09 NOTE — Telephone Encounter (Signed)
No answer. Left message to call back.   

## 2019-04-09 NOTE — Telephone Encounter (Signed)
Spoke with patient. She thought I had made appointment for her to see Dr Humphrey Rolls. She had spoken with a friend who said she had a negative experience with Dr Chancy Milroy. Advised that she has appointment with Dr Virl Axe for this week. She apologized and was sorry for all the trouble. She is fine to keep appointment with Dr Caryl Comes and was very appreciative.

## 2019-04-09 NOTE — Telephone Encounter (Signed)
Results called to pt. Pt verbalized understanding. Patient agreeable to come in to see Dr Caryl Comes this Thursday, 04/12/19 at 8:40 am.  Appt scheduled.

## 2019-04-09 NOTE — Telephone Encounter (Signed)
Patient would like a referral to seek a 2nd opinion with Dr. Ubaldo Glassing at [facility]   Please call to discuss.

## 2019-04-09 NOTE — Telephone Encounter (Signed)
Patient calling to discuss recent testing results   Please call

## 2019-04-09 NOTE — Telephone Encounter (Signed)
-----   Message from Kate Sable, MD sent at 04/06/2019  3:55 PM EDT ----- Please schedule patient for EP referral/ appointment. ekg and holter showing wpw and svt. Thank you

## 2019-04-11 NOTE — Progress Notes (Deleted)
Cardiology Office Note    Date:  04/11/2019   ID:  Dana, Graves 10-28-1975, MRN [id number]  PCP:  Lucille Passy, MD  Cardiologist:  Kate Sable, MD  Electrophysiologist:  None   Chief Complaint: Follow-up  History of Present Illness:   Dana Graves is a 43 y.o. female with history of ***  Past Medical History:  Diagnosis Date  . Dyspareunia   . Pelvic pain in female   . Seasonal allergies   . Wears contact lenses     Past Surgical History:  Procedure Laterality Date  . BREAST ENHANCEMENT SURGERY  feb  2002  . LAPAROSCOPY N/A 12/13/2014   Procedure: LAPAROSCOPY DIAGNOSTIC ;  Surgeon: Marylynn Pearson, MD;  Location: [facility];  Service: Gynecology;  Laterality: N/A;  . LAPAROSCOPY FOR ECTOPIC PREGNANCY  2009 approx.   Unilateral Salpingostomy    Current Medications: No outpatient medications have been marked as taking for the 04/16/19 encounter (Appointment) with Rise Mu, PA-C.    Allergies:   Patient has no known allergies.   Social History   Socioeconomic History  . Marital status: Married    Spouse name: Not on file  . Number of children: 2  . Years of education: Not on file  . Highest education level: Not on file  Occupational History  . Occupation: work from home  Social Needs  . Financial resource strain: Not on file  . Food insecurity    Worry: Not on file    Inability: Not on file  . Transportation needs    Medical: Not on file    Non-medical: Not on file  Tobacco Use  . Smoking status: Never Smoker  . Smokeless tobacco: Never Used  Substance and Sexual Activity  . Alcohol use: Yes    Alcohol/week: 7.0 standard drinks    Types: 7 Glasses of wine per week    Comment: 1-2 DAILY WINE  . Drug use: No  . Sexual activity: Yes    Birth control/protection: Pill  Lifestyle  . Physical activity    Days per week: Not on file    Minutes per session: Not on file  . Stress: Not on file  Relationships  .  Social Herbalist on phone: Not on file    Gets together: Not on file    Attends religious service: Not on file    Active member of club or organization: Not on file    Attends meetings of clubs or organizations: Not on file    Relationship status: Not on file  Other Topics Concern  . Not on file  Social History Narrative  . Not on file     Family History:  The patient's family history includes Breast cancer in her maternal grandmother; Colon cancer in her paternal grandfather; Colon polyps in her father; Diabetes in her mother; Heart attack in her maternal grandfather.  ROS:   ROS   EKGs/Labs/Other Studies Reviewed:    Studies reviewed were summarized above. The additional studies were reviewed today:  Zio 02/2019: Patient had a min HR of 49 bpm, max HR of 162 bpm, and avg HR of 84 bpm. Predominant underlying rhythm was Sinus Rhythm. Delta wave consistent with Wolff-Parkinson-White (WPW) pattern was noted throughout the recording. 3 Supraventricular Tachycardia runs occurred, the run with the fastest interval lasting 5 beats with a max rate of 162 bpm, the longest lasting 7 beats with an avg rate of 140 bpm. Isolated SVEs  were rare (<1.0%), SVE Couplets were rare (<1.0%), and SVE Triplets were rare (<1.0%). Isolated VEs were occasional (3.2%, [id number]), VE Couplets were rare (<1.0%, 519), and no VE Triplets were present. __________  2D echo 03/2019: 1. Left ventricular ejection fraction, by visual estimation, is 60 to 65%. The left ventricle has normal function. Normal left ventricular size. There is no left ventricular hypertrophy.  2. Global right ventricle has normal systolic function.The right ventricular size is normal. No increase in right ventricular wall thickness.  3. Left atrial size was normal.  4. Normal pulmonary artery systolic pressure.   EKG:  EKG is ordered today.  The EKG ordered today demonstrates ***  Recent Labs: No results found for requested  labs within last 8760 hours.  Recent Lipid Panel    Component Value Date/Time   CHOL 164 02/12/2015 1131   TRIG 163.0 (H) 02/12/2015 1131   HDL 58.20 02/12/2015 1131   CHOLHDL 3 02/12/2015 1131   VLDL 32.6 02/12/2015 1131   LDLCALC 73 02/12/2015 1131    PHYSICAL EXAM:    VS:  There were no vitals taken for this visit.  BMI: There is no height or weight on file to calculate BMI.  Physical Exam  Wt Readings from Last 3 Encounters:  03/05/19 147 lb 8 oz (66.9 kg)  11/11/16 146 lb 2 oz (66.3 kg)  10/07/16 146 lb (66.2 kg)     ASSESSMENT & PLAN:   1. ***  Disposition: F/u with Dr. Garen Lah or an APP in ***.   Medication Adjustments/Labs and Tests Ordered: Current medicines are reviewed at length with the patient today.  Concerns regarding medicines are outlined above. Medication changes, Labs and Tests ordered today are summarized above and listed in the Patient Instructions accessible in Encounters.   Signed, Christell Faith, PA-C 04/11/2019 4:16 PM     [address] [place], [place] [address] [phone]

## 2019-04-11 NOTE — Progress Notes (Signed)
ELECTROPHYSIOLOGY CONSULT NOTE  Patient ID: Dana Graves, MRN: [id number], DOB/AGE: [birth year] 45 y.o. Admit date: (Not on file) Date of Consult: 04/12/2019  Primary Physician: Lucille Passy, MD Primary Cardiologist: [facility]     Dana Graves is a 43 y.o. female who is being seen today for the evaluation of palpitations at the request of [facility].    HPI Dana Graves is a 43 y.o. female with a 5-monthhistory of palpitations.  She describes it as flutters.  More fast than hard.  Lasting seconds or perhaps longer.  Can go on for hours during the day.  No associated shortness of breath but discomfort with the awareness. More aware when she is sitting as opposed to lying and/or standing.  No triggers of exercise or stimulants i.e. coffee or chocolate  No impairment of exercise tolerance.  No history of syncope or presyncope  DATE TEST EF   9/20 Echo  60-65%           Past Medical History:  Diagnosis Date  . Dyspareunia   . Pelvic pain in female   . PVC's (premature ventricular contractions)   . Seasonal allergies   . Ventricular pre-excitation   . Wears contact lenses       Surgical History:  Past Surgical History:  Procedure Laterality Date  . BREAST ENHANCEMENT SURGERY  feb  2002  . LAPAROSCOPY N/A 12/13/2014   Procedure: LAPAROSCOPY DIAGNOSTIC ;  Surgeon: GMarylynn Pearson MD;  [facility]  Service: Gynecology;  Laterality: N/A;  . LAPAROSCOPY FOR ECTOPIC PREGNANCY  2009 approx.   Unilateral Salpingostomy     Home Meds: Current Meds  Medication Sig  . cephALEXin (KEFLEX) 250 MG capsule Take 250 mg by mouth 2 (two) times daily.  . norgestimate-ethinyl estradiol (ORTHO-CYCLEN,SPRINTEC,PREVIFEM) 0.25-35 MG-MCG tablet Take 1 tablet by mouth daily.    Allergies: No Known Allergies  Social History   Socioeconomic History  . Marital status: Married    Spouse name: Not on file  . Number of children: 2  . Years of  education: Not on file  . Highest education level: Not on file  Occupational History  . Occupation: work from home  Social Needs  . Financial resource strain: Not on file  . Food insecurity    Worry: Not on file    Inability: Not on file  . Transportation needs    Medical: Not on file    Non-medical: Not on file  Tobacco Use  . Smoking status: Never Smoker  . Smokeless tobacco: Never Used  Substance and Sexual Activity  . Alcohol use: Yes    Alcohol/week: 7.0 standard drinks    Types: 7 Glasses of wine per week    Comment: 1-2 DAILY WINE  . Drug use: No  . Sexual activity: Yes    Birth control/protection: Pill  Lifestyle  . Physical activity    Days per week: Not on file    Minutes per session: Not on file  . Stress: Not on file  Relationships  . Social cHerbaliston phone: Not on file    Gets together: Not on file    Attends religious service: Not on file    Active member of club or organization: Not on file    Attends meetings of clubs or organizations: Not on file    Relationship status: Not on file  . Intimate partner violence    Fear of current or ex  partner: Not on file    Emotionally abused: Not on file    Physically abused: Not on file    Forced sexual activity: Not on file  Other Topics Concern  . Not on file  Social History Narrative  . Not on file     Family History  Problem Relation Age of Onset  . Breast cancer Maternal Grandmother   . Colon cancer Paternal Grandfather   . Diabetes Mother   . Colon polyps Father   . Heart attack Maternal Grandfather      ROS:  Please see the history of present illness.     All other systems reviewed and negative.    Physical Exam: Blood pressure 123/83, pulse 70, height 5' 8"  (1.727 m), weight 150 lb 12 oz (68.4 kg), SpO2 98 %. General: Well developed, well nourished female in no acute distress. Head: Normocephalic, atraumatic, sclera non-icteric, no xanthomas, nares are without discharge. EENT:  normal  Lymph Nodes:  none Neck: Negative for carotid bruits. JVD not elevated. Back:without scoliosis kyphosis Lungs: Clear bilaterally to auscultation without wheezes, rales, or rhonchi. Breathing is unlabored. Heart: Irregular RR with S1 S2. No   murmur . No rubs, or gallops appreciated. Abdomen: Soft, non-tender, non-distended with normoactive bowel sounds. No hepatomegaly. No rebound/guarding. No obvious abdominal masses. Msk:  Strength and tone appear normal for age. Extremities: No clubbing or cyanosis. No edema.  Distal pedal pulses are 2+ and equal bilaterally. Skin: Warm and Dry Neuro: Alert and oriented X 3. CN III-XII intact Grossly normal sensory and motor function . Psych:  Responds to questions appropriately with a normal affect.      Labs: Cardiac Enzymes No results for input(s): CKTOTAL, CKMB, TROPONINI in the last 72 hours. CBC Lab Results  Component Value Date   WBC 7.6 02/12/2015   HGB 14.3 02/12/2015   HCT 41.6 02/12/2015   MCV 92.0 02/12/2015   PLT 230.0 02/12/2015   PROTIME: No results for input(s): LABPROT, INR in the last 72 hours. Chemistry No results for input(s): NA, K, CL, CO2, BUN, CREATININE, CALCIUM, PROT, BILITOT, ALKPHOS, ALT, AST, GLUCOSE in the last 168 hours.  Invalid input(s): LABALBU Lipids Lab Results  Component Value Date   CHOL 164 02/12/2015   HDL 58.20 02/12/2015   LDLCALC 73 02/12/2015   TRIG 163.0 (H) 02/12/2015   BNP No results found for: PROBNP Thyroid Function Tests: No results for input(s): TSH, T4TOTAL, T3FREE, THYROIDAB in the last 72 hours.  Invalid input(s): FREET3 Miscellaneous No results found for: DDIMER  Radiology/Studies:  No results found.  EKG: * Personally reviewed  Sinus with ventricular preexcitation with prob R post septal pathway  Event Recorder personnally reviewed  Nonsustained WCT >>atrial tach with maximal preexcitation   Assessment and Plan:  Ventricular Preexcitation  PVCs 3.9 %    Atrial tachycardia-nonsustained  Alcohol      The patient has ventricular preexcitation.  She has palpitations and hence she is appropriately classified as Wolff-Parkinson-White syndrome.  We have reviewed the concerns including that of sudden death.  There appears on one of the ECGs to be variability in the QRS morphology or perhaps a suggestion of an isoelectric PR segment associated with the shorter R-R interval.  This could represent either an alternative pathway or some degree of conduction slowing in the pathway that is evident and thereby might identify a lower risk cohort.  To clarify this further will obtain treadmill testing.  Because of the preexcitation, calcium blockers are precluded; we will  give a prescription for beta-blockers, atenolol 50, metoprolol succinate 50, bisoprolol 5 and try to find one that she tolerates and is effective in diminishing her symptoms.   Lia Hopping

## 2019-04-12 ENCOUNTER — Other Ambulatory Visit: Payer: Self-pay

## 2019-04-12 ENCOUNTER — Ambulatory Visit (INDEPENDENT_AMBULATORY_CARE_PROVIDER_SITE_OTHER): Payer: No Typology Code available for payment source | Admitting: Internal Medicine

## 2019-04-12 ENCOUNTER — Encounter: Payer: Self-pay | Admitting: Internal Medicine

## 2019-04-12 VITALS — BP 123/83 | HR 70 | Ht 68.0 in | Wt 150.8 lb

## 2019-04-12 DIAGNOSIS — I456 Pre-excitation syndrome: Secondary | ICD-10-CM | POA: Diagnosis not present

## 2019-04-12 DIAGNOSIS — I471 Supraventricular tachycardia: Secondary | ICD-10-CM

## 2019-04-12 MED ORDER — ATENOLOL 50 MG PO TABS: ORAL_TABLET | ORAL | 0 refills | Status: DC

## 2019-04-12 MED ORDER — BISOPROLOL FUMARATE 5 MG PO TABS: ORAL_TABLET | ORAL | 0 refills | Status: DC

## 2019-04-12 MED ORDER — METOPROLOL SUCCINATE ER 50 MG PO TB24: ORAL_TABLET | ORAL | 0 refills | Status: DC

## 2019-04-12 NOTE — Patient Instructions (Addendum)
Medication Instructions:  - Your physician has recommended you make the following change in your medication:   You are being given 3 different beta blocker medications to try. You may take them in any order, but DO NOT take more than 1 at a time. Try to give at least 2 weeks on each one to see which you like the best. Once you determine which one you prefer, please call and let us know and we will send in more refills for you. [phone]  1)  Atenolol 50 mg- take 1 tablet by mouth once daily 2) Metoprolol succinate 50 mg- take 1 tablet by mouth once daily 3) Bisoprolol 5 mg- take 1 tablet by mouth once daily  *If you need a refill on your cardiac medications before your next appointment, please call your pharmacy*  Lab Work: - none ordered  If you have labs (blood work) drawn today and your tests are completely normal, you will receive your results only by: Marland Kitchen MyChart Message (if you have MyChart) OR . A paper copy in the mail If you have any lab test that is abnormal or we need to change your treatment, we will call you to review the results.  Testing/Procedures: - Your physician has requested that you have an exercise tolerance test (on a day Dr. Caryl Comes is in the office- ok to do at 8:00 am)  - you may eat a light breakfast/ lunch prior to your procedure - no caffeine for 24 hours prior to your test (coffee, tea, soft drinks, or chocolate)  - no smoking/ vaping for 4 hours prior to your test - you may take your regular medications the day of your test except for: HOLD your beta blocker (atenolol/ metoprolol/ or bisoprolol) for 24 hours prior to the test.  - bring any inhalers with you to your test - wear comfortable clothing & tennis/ non-skid shoes to walk on the treadmill  Follow-Up: At [facility], you and your health needs are our priority.  As part of our continuing mission to provide you with exceptional heart care, we have created designated Provider Care Teams.  These  Care Teams include your primary Cardiologist (physician) and Advanced Practice Providers (APPs -  Physician Assistants and Nurse Practitioners) who all work together to provide you with the care you need, when you need it.  Your next appointment:   pending the results of your treadmill test  The format for your next appointment:   n/a  Provider:   n/a  Other Instructions - n/a   Exercise Stress Test  An exercise stress test is a test that is done to collect information about how your heart functions during exercise. The test is done while you are walking on a treadmill or using an exercise bike. The goal is to raise your heart rate and "stress" the heart. The heart is evaluated before, during, and after you exercise. An electrocardiogram (ECG) will be used to monitor the heart, and your blood pressure will also be monitored. In some cases, nuclear scanning or an ultrasound of the heart (echocardiogram) will also be done to evaluate your heart. An exercise stress test is done to look for coronary artery disease (CAD). The test may also be done to:  Evaluate your limits of exercise during cardiac rehabilitation.  Check for high blood pressure during exercise.  Check how well you can exercise after such treatments as coronary stenting or new medicines.  Check for problems with blood flow to your arms  and legs during exercise. If you have an abnormal test result, this may mean that you are not getting enough blood flow to your heart during exercise. More testing may be needed to understand why your test was not normal. Tell a health care provider about:  Any allergies you have.  All medicines you are taking, including vitamins, herbs, eye drops, creams, and over-the-counter medicines.  Any blood disorders you have.  Any surgeries you have had.  Any medical conditions you have.  Whether you are pregnant or may be pregnant. What are the risks? Generally, this is a safe procedure.  However, problems may occur, including:  Pain or pressure in the following areas: ? Chest. ? Jaw or neck. ? Between your shoulder blades. ? Down your left arm.  Dizziness or lightheadedness.  Shortness of breath.  Increased or irregular heartbeats.  Nausea or vomiting.  Heart attack (rare).  Life-threatening abnormal heart rhythm (rare). What happens before the procedure?  Follow instructions from your health care provider about eating or drinking restrictions. ? You may be told to avoid all forms of caffeine for 24 hours before the test. This includes coffee, tea (even decaffeinated tea), caffeinated sodas, chocolate, cocoa, and certain pain medicines.  Ask your health care provider about: ? Taking over-the-counter medicines, vitamins, herbs, and supplements. ? Changing or stopping your regular medicines. This is especially important if you are taking diabetes medicines or beta-blocker medicines.  If you have diabetes, ask how you are to take your insulin or pills. It is common to adjust your insulin dose the morning of the test.  If you are taking beta-blocker medicines, it is important to talk to your health care provider about these medicines well before the date of your test. Taking beta-blocker medicines may interfere with the test. In some cases, these medicines may need to be changed or stopped 24 hours or more before the test.  If you wear a nitroglycerin patch, it may need to be removed prior to the test. Ask your health care provider if the patch should be removed before the test.  If you use an inhaler for any breathing condition, bring it with you to the test.  Do not apply lotions, powders, creams, or oils on your chest prior to the test.  Wear loose-fitting clothes and comfortable walking shoes.  Do not use any products that contain nicotine or tobacco, such as cigarettes and e-cigarettes, for 4 hours before the test or as told by your health care provider. If  you need help quitting, ask your health care provider. What happens during the procedure?  Multiple electrodes will be attached to your chest.  Multiple wires will be attached to the electrodes. These will transfer the electrical impulses from your heart to the ECG machine. Your heart will be monitored both at rest and while exercising.  If you are also having an echocardiogram or nuclear scanning, images of your heart will be taken before and after you exercise.  A blood pressure cuff will be placed around your arm to measure your blood pressure throughout the test. You will feel it tighten and loosen throughout the test.  You will walk on a treadmill or use a stationary bike. If you cannot use these, you may be asked to turn a crank with your hands.  You will start at a slow pace or level on the exercise machine. The exercise difficulty will be slowly increased to raise your heart rate. In the case of a treadmill, the  speed and incline will gradually be increased.  You may be asked to periodically breathe into a tube. This measures the gases you breathe out.  You will be asked how you are feeling throughout the test. You will be asked to rate your level of exertion.  Tell the staff right away if you feel: ? Chest pain. ? Dizziness. ? Shortness of breath. ? Too fatigued to continue. ? Pain or aching in your legs or arms.  You will exercise until you have symptoms or until you reach a target heart rate. The test will also be stopped if you have changes in your blood pressure or ECG readings, or if you develop an irregular heartbeat (arrhythmia). The procedure may vary among health care providers and hospitals. What happens after the procedure?  You will sit down and recover from the exercise. Your blood pressure, heart rate, and ECG will be monitored until you recover.  You may return to your normal schedule, including diet, activities, and medicines, unless your health care provider  tells you otherwise.  It is up to you to get your test results. Ask your health care provider, or the department that is doing the test, when your results will be ready. Summary  An exercise stress test is a test that is done to collect information about how your heart functions during exercise.  This test is done to look for coronary artery disease (CAD).  During this test, you will walk on a treadmill or use an exercise bike to raise your heart rate.  It is important to follow instructions from your health care provider about eating and drinking restrictions before the test. This may include avoiding caffeine and certain medicines before the test. This information is not intended to replace advice given to you by your health care provider. Make sure you discuss any questions you have with your health care provider. Document Released: 06/04/2000 Document Revised: 03/10/2017 Document Reviewed: 08/11/2016 Elsevier Patient Education  2020 [employer].

## 2019-04-16 ENCOUNTER — Ambulatory Visit: Payer: No Typology Code available for payment source | Admitting: Physician Assistant

## 2019-04-16 ENCOUNTER — Other Ambulatory Visit
Admission: RE | Admit: 2019-04-16 | Discharge: 2019-04-16 | Disposition: A | Discharge status: Home / Self Care | Payer: PRIVATE HEALTH INSURANCE | Source: Ambulatory Visit | Attending: Internal Medicine | Admitting: Internal Medicine

## 2019-04-16 ENCOUNTER — Other Ambulatory Visit: Payer: Self-pay

## 2019-04-16 DIAGNOSIS — Z01812 Encounter for preprocedural laboratory examination: Secondary | ICD-10-CM | POA: Diagnosis not present

## 2019-04-16 DIAGNOSIS — Z20828 Contact with and (suspected) exposure to other viral communicable diseases: Secondary | ICD-10-CM | POA: Insufficient documentation

## 2019-04-16 LAB — SARS CORONAVIRUS 2 (TAT 6-24 HRS): SARS Coronavirus 2: NEGATIVE

## 2019-04-17 ENCOUNTER — Encounter: Payer: No Typology Code available for payment source | Admitting: Internal Medicine

## 2019-04-19 ENCOUNTER — Ambulatory Visit: Payer: No Typology Code available for payment source | Admitting: Internal Medicine

## 2019-04-19 ENCOUNTER — Other Ambulatory Visit: Payer: Self-pay

## 2019-04-19 ENCOUNTER — Other Ambulatory Visit: Payer: Self-pay | Admitting: *Deleted

## 2019-04-19 DIAGNOSIS — I456 Pre-excitation syndrome: Secondary | ICD-10-CM

## 2019-04-19 LAB — EXERCISE TOLERANCE TEST
Estimated workload: 15.3 METS
Exercise duration (min): 9 min
Exercise duration (sec): 42 s
MPHR: 176 {beats}/min
Peak HR: 171 {beats}/min
Percent HR: 97 %
Rest HR: 93 {beats}/min

## 2019-05-21 ENCOUNTER — Ambulatory Visit: Payer: No Typology Code available for payment source | Admitting: Internal Medicine

## 2019-05-21 ENCOUNTER — Encounter: Payer: Self-pay | Admitting: Internal Medicine

## 2019-05-21 ENCOUNTER — Other Ambulatory Visit: Payer: Self-pay

## 2019-05-21 DIAGNOSIS — I456 Pre-excitation syndrome: Secondary | ICD-10-CM | POA: Diagnosis not present

## 2019-05-21 NOTE — Patient Instructions (Addendum)
Medication Instructions:  Your physician recommends that you continue on your current medications as directed. Please refer to the Current Medication list given to you today.  Labwork: None ordered.  Testing/Procedures: Your physician has recommended that you have an ablation. Catheter ablation is a medical procedure used to treat some cardiac arrhythmias (irregular heartbeats). During catheter ablation, a long, thin, flexible tube is put into a blood vessel in your groin (upper thigh), or neck. This tube is called an ablation catheter. It is then guided to your heart through the blood vessel. Radio frequency waves destroy small areas of heart tissue where abnormal heartbeats may cause an arrhythmia to start. Please see the instruction sheet given to you today.   Follow-Up:  Days available for procedures:  December 10 at 1:30 pm December 21, 23 January 11, 18, 25  Any Other Special Instructions Will Be Listed Below (If Applicable).  If you need a refill on your cardiac medications before your next appointment, please call your pharmacy.    Cardiac Ablation Cardiac ablation is a procedure to disable (ablate) a small amount of heart tissue in very specific places. The heart has many electrical connections. Sometimes these connections are abnormal and can cause the heart to beat very fast or irregularly. Ablating some of the problem areas can improve the heart rhythm or return it to normal. Ablation may be done for people who:  Have Wolff-Parkinson-White syndrome.  Have fast heart rhythms (tachycardia).  Have taken medicines for an abnormal heart rhythm (arrhythmia) that were not effective or caused side effects.  Have a high-risk heartbeat that may be life-threatening. During the procedure, a small incision is made in the neck or the groin, and a long, thin, flexible tube (catheter) is inserted into the incision and moved to the heart. Small devices (electrodes) on the tip of the  catheter will send out electrical currents. A type of X-ray (fluoroscopy) will be used to help guide the catheter and to provide images of the heart. Tell a health care provider about:  Any allergies you have.  All medicines you are taking, including vitamins, herbs, eye drops, creams, and over-the-counter medicines.  Any problems you or family members have had with anesthetic medicines.  Any blood disorders you have.  Any surgeries you have had.  Any medical conditions you have, such as kidney failure.  Whether you are pregnant or may be pregnant. What are the risks? Generally, this is a safe procedure. However, problems may occur, including:  Infection.  Bruising and bleeding at the catheter insertion site.  Bleeding into the chest, especially into the sac that surrounds the heart. This is a serious complication.  Stroke or blood clots.  Damage to other structures or organs.  Allergic reaction to medicines or dyes.  Need for a permanent pacemaker if the normal electrical system is damaged. A pacemaker is a small computer that sends electrical signals to the heart and helps your heart beat normally.  The procedure not being fully effective. This may not be recognized until months later. Repeat ablation procedures are sometimes required. What happens before the procedure?  Follow instructions from your health care provider about eating or drinking restrictions.  Ask your health care provider about: ? Changing or stopping your regular medicines. This is especially important if you are taking diabetes medicines or blood thinners. ? Taking medicines such as aspirin and ibuprofen. These medicines can thin your blood. Do not take these medicines before your procedure if your health care provider instructs  you not to.  Plan to have someone take you home from the hospital or clinic.  If you will be going home right after the procedure, plan to have someone with you for 24  hours. What happens during the procedure?  To lower your risk of infection: ? Your health care team will wash or sanitize their hands. ? Your skin will be washed with soap. ? Hair may be removed from the incision area.  An IV tube will be inserted into one of your veins.  You will be given a medicine to help you relax (sedative).  The skin on your neck or groin will be numbed.  An incision will be made in your neck or your groin.  A needle will be inserted through the incision and into a large vein in your neck or groin.  A catheter will be inserted into the needle and moved to your heart.  Dye may be injected through the catheter to help your surgeon see the area of the heart that needs treatment.  Electrical currents will be sent from the catheter to ablate heart tissue in desired areas. There are three types of energy that may be used to ablate heart tissue: ? Heat (radiofrequency energy). ? Laser energy. ? Extreme cold (cryoablation).  When the necessary tissue has been ablated, the catheter will be removed.  Pressure will be held on the catheter insertion area to prevent excessive bleeding.  A bandage (dressing) will be placed over the catheter insertion area. The procedure may vary among health care providers and hospitals. What happens after the procedure?  Your blood pressure, heart rate, breathing rate, and blood oxygen level will be monitored until the medicines you were given have worn off.  Your catheter insertion area will be monitored for bleeding. You will need to lie still for a few hours to ensure that you do not bleed from the catheter insertion area.  Do not drive for 24 hours or as long as directed by your health care provider. Summary  Cardiac ablation is a procedure to disable (ablate) a small amount of heart tissue in very specific places. Ablating some of the problem areas can improve the heart rhythm or return it to normal.  During the procedure,  electrical currents will be sent from the catheter to ablate heart tissue in desired areas. This information is not intended to replace advice given to you by your health care provider. Make sure you discuss any questions you have with your health care provider. Document Released: 10/24/2008 Document Revised: 11/28/2017 Document Reviewed: 04/26/2016 Elsevier Patient Education  2020 [employer].

## 2019-05-21 NOTE — Progress Notes (Signed)
HPI Dana Graves is referred today by Dr. 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 She has been asymptomatic until about 6 months ago and was found on ECG to have WPW. She also has PVC's and I suspect is symptomatic for both. She has not had syncope. She has been placed on beta blockers but notes fatigue and she does not sleep well on the beta blockers. No Known Allergies   Current Outpatient Medications  Medication Sig Dispense Refill  . metoprolol succinate (TOPROL-XL) 50 MG 24 hr tablet Take 1 tablet (50 mg) by mouth once daily. Take with or immediately following a meal. 30 tablet 0  . norgestimate-ethinyl estradiol (ORTHO-CYCLEN,SPRINTEC,PREVIFEM) 0.25-35 MG-MCG tablet Take 1 tablet by mouth daily.     No current facility-administered medications for this visit.      Past Medical History:  Diagnosis Date  . Dyspareunia   . Pelvic pain in female   . PVC's (premature ventricular contractions)   . Seasonal allergies   . Ventricular pre-excitation   . Wears contact lenses     ROS:   All systems reviewed and negative except as noted in the HPI.   Past Surgical History:  Procedure Laterality Date  . BREAST ENHANCEMENT SURGERY  feb  2002  . LAPAROSCOPY N/A 12/13/2014   Procedure: LAPAROSCOPY DIAGNOSTIC ;  Surgeon: Marylynn Pearson, MD;  Location: [facility];  Service: Gynecology;  Laterality: N/A;  . LAPAROSCOPY FOR ECTOPIC PREGNANCY  2009 approx.   Unilateral Salpingostomy     Family History  Problem Relation Age of Onset  . Breast cancer Maternal Grandmother   . Colon cancer Paternal Grandfather   . Diabetes Mother   . Colon polyps Father   . Heart attack Maternal Grandfather      Social History   Socioeconomic History  . Marital status: Married    Spouse name: Not on file  . Number of children: 2  . Years of education: Not on file  . Highest education level: Not on file  Occupational History  . Occupation: work from home  Social  Needs  . Financial resource strain: Not on file  . Food insecurity    Worry: Not on file    Inability: Not on file  . Transportation needs    Medical: Not on file    Non-medical: Not on file  Tobacco Use  . Smoking status: Never Smoker  . Smokeless tobacco: Never Used  Substance and Sexual Activity  . Alcohol use: Yes    Alcohol/week: 7.0 standard drinks    Types: 7 Glasses of wine per week    Comment: 1-2 DAILY WINE  . Drug use: No  . Sexual activity: Yes    Birth control/protection: Pill  Lifestyle  . Physical activity    Days per week: Not on file    Minutes per session: Not on file  . Stress: Not on file  Relationships  . Social Herbalist on phone: Not on file    Gets together: Not on file    Attends religious service: Not on file    Active member of club or organization: Not on file    Attends meetings of clubs or organizations: Not on file    Relationship status: Not on file  . Intimate partner violence    Fear of current or ex partner: Not on file    Emotionally abused: Not on file    Physically abused: Not on file  Forced sexual activity: Not on file  Other Topics Concern  . Not on file  Social History Narrative  . Not on file     BP 108/70   Pulse (!) 59   Ht 5' 8"  (1.727 m)   Wt 154 lb (69.9 kg)   BMI 23.42 kg/m   Physical Exam:  Well appearing NAD HEENT: Unremarkable Neck:  No JVD, no thyromegally Lymphatics:  No adenopathy Back:  No CVA tenderness Lungs:  Clear with no wheezes HEART:  Regular rate rhythm, no murmurs, no rubs, no clicks Abd:  soft, positive bowel sounds, no organomegally, no rebound, no guarding Ext:  2 plus pulses, no edema, no cyanosis, no clubbing Skin:  No rashes no nodules Neuro:  CN II through XII intact, motor grossly intact  EKG - nsr with PVC's and ventricular pre-excitation  Assess/Plan: 1. WPW - it appears that he has either a right postero or mid septal pathway. She is symptomatic. It did not go  away with exertion.  I have discussed the treatment options. The risks/benefits/goals/expectations of EPS/RFA of catheter ablation were reviewed and she will call us if she wishes to proceed. 2. PVC's - I suspect that this is much of her actual symptoms. I have discussed the treatment options and if the patient is having PVC's we will try and ablate them as well at the time of her WPW ablation. I spent 25 minutes including 50% face to face time outlining the treatment with the patient. Mikle [email or web address].D.

## 2019-05-22 ENCOUNTER — Telehealth: Payer: Self-pay | Admitting: Internal Medicine

## 2019-05-22 DIAGNOSIS — I456 Pre-excitation syndrome: Secondary | ICD-10-CM

## 2019-05-22 NOTE — Telephone Encounter (Signed)
New Message  Patient is calling in to get the ablation scheduled. Wants to know if 07/02/2019 is still available for scheduling. Please give patient a call back to assist.

## 2019-05-23 NOTE — Telephone Encounter (Signed)
Follow Up  Patient is calling back in about scheduling the ablation. Please give patient a call back at [phone].

## 2019-05-28 NOTE — Telephone Encounter (Signed)
Pt is scheduled for ablation on January 11.  Will get labs/covid test in [place].  Instruction letter complete.  Left message for [facility] PAT to schedule covid test.  Work up complete.

## 2019-06-29 ENCOUNTER — Other Ambulatory Visit: Payer: No Typology Code available for payment source

## 2019-07-09 ENCOUNTER — Other Ambulatory Visit
Admission: RE | Admit: 2019-07-09 | Discharge: 2019-07-09 | Disposition: A | Discharge status: Home / Self Care | Payer: No Typology Code available for payment source | Source: Ambulatory Visit | Attending: Internal Medicine | Admitting: Internal Medicine

## 2019-07-09 ENCOUNTER — Telehealth: Payer: Self-pay | Admitting: Internal Medicine

## 2019-07-09 DIAGNOSIS — Z20822 Contact with and (suspected) exposure to covid-19: Secondary | ICD-10-CM | POA: Diagnosis not present

## 2019-07-09 DIAGNOSIS — Z01812 Encounter for preprocedural laboratory examination: Secondary | ICD-10-CM | POA: Diagnosis not present

## 2019-07-09 LAB — SARS CORONAVIRUS 2 (TAT 6-24 HRS): SARS Coronavirus 2: NEGATIVE

## 2019-07-09 NOTE — Telephone Encounter (Signed)
Patient is calling stating she was under the impression she was supposed to have lab work performed at her Covid screening this morning 07/09/19. She states the hospital advised her at her Covid screening the labs will be performed before her procedure on Friday 07/13/19 & she just wanted to clarify that with our office. Please advise.

## 2019-07-09 NOTE — Telephone Encounter (Signed)
lpmtcb 1/18

## 2019-07-09 NOTE — Telephone Encounter (Signed)
I spoke to the patient who went for her Bailey and was supposed to get labs drawn at same time at [facility].  They told the patient that labs are to be drawn on the 22nd, morning of the procedure.    She was told that they called here and that someone verified this, no record of.  Please advise on this.

## 2019-07-09 NOTE — Telephone Encounter (Signed)
Follow up ° ° °Patient is returning your call. Please call. ° ° ° °

## 2019-07-10 ENCOUNTER — Telehealth: Payer: Self-pay

## 2019-07-13 ENCOUNTER — Encounter (HOSPITAL_COMMUNITY): Admission: RE | Payer: PRIVATE HEALTH INSURANCE | Source: Home / Self Care

## 2019-07-13 ENCOUNTER — Ambulatory Visit (HOSPITAL_COMMUNITY)
Admission: RE | Admit: 2019-07-13 | Payer: No Typology Code available for payment source | Source: Home / Self Care | Admitting: Internal Medicine

## 2019-07-13 SURGERY — V TACH ABLATION

## 2019-07-31 NOTE — Telephone Encounter (Signed)
No further action needed at this time.

## 2019-08-02 ENCOUNTER — Ambulatory Visit: Payer: No Typology Code available for payment source | Admitting: Internal Medicine

## 2019-08-13 ENCOUNTER — Ambulatory Visit: Payer: No Typology Code available for payment source | Admitting: Internal Medicine

## 2020-03-18 ENCOUNTER — Telehealth: Payer: Self-pay | Admitting: Internal Medicine

## 2020-03-18 NOTE — Telephone Encounter (Signed)
New message:    Patient would like for a nurse to call concering her chest feeling uncomfortable. Please call patient.

## 2020-03-18 NOTE — Telephone Encounter (Signed)
Will see Pt 10/1

## 2020-03-18 NOTE — Telephone Encounter (Signed)
Called patient regarding symptoms of chest discomfort - reports "it feels like there is a pill stuck in my throat." States she feels a pain when she takes a deep breath; denies SOB. Identifies the location as the middle of her chest; denies symptoms worsen with exertion. Reports it does not feel like her usual WPW palpitations; states she is not taking Toprol any longer because she has not had problems with fluttering or palpitations recently. States it feels like her vitamins got stuck last week. She felt worse yesterday after taking vitamins and is also experiencing nausea. Taking Omega 3, Vitamins C, B and Zinc daily - states she hasn't taken them today due to discomfort. I advised her to avoid drinking very cold liquids and that she may want to hold vitamins until discomfort improves. She reports she does not have a PCP. I advised she may have to establish with PCP for evaluation of swallowing difficulties. I advised I will forward to [facility] and Dr. Lovena Le for awareness. She states she has an appointment October 11 and would love to be seen sooner for peace of mind. She thanked me for the call.

## 2020-03-21 ENCOUNTER — Ambulatory Visit (INDEPENDENT_AMBULATORY_CARE_PROVIDER_SITE_OTHER): Payer: No Typology Code available for payment source | Admitting: Internal Medicine

## 2020-03-21 ENCOUNTER — Encounter: Payer: Self-pay | Admitting: Internal Medicine

## 2020-03-21 ENCOUNTER — Other Ambulatory Visit: Payer: Self-pay

## 2020-03-21 VITALS — BP 116/72 | HR 70 | Ht 68.0 in | Wt 145.0 lb

## 2020-03-21 DIAGNOSIS — I456 Pre-excitation syndrome: Secondary | ICD-10-CM

## 2020-03-21 MED ORDER — FLECAINIDE ACETATE 50 MG PO TABS: 50.0000 mg | ORAL_TABLET | Freq: Two times a day (BID) | ORAL | 3 refills | Status: DC

## 2020-03-21 NOTE — Progress Notes (Signed)
HPI Ms. Benecke returns today for followup. She is a pleasant 44 yo woman with a h/o WPW and symptomatic PVC's. I had planned for her to undergo EP study and catheter ablation several months ago but her insurance was not compatible and she had to cancel. In the interim, she has continued to have symptoms. She denies chest pain but does have palpitations and the sensation of an abnormal heart beat.  No Known Allergies   Current Outpatient Medications  Medication Sig Dispense Refill  . KURVELO 0.15-30 MG-MCG tablet Take 1 tablet by mouth daily.     No current facility-administered medications for this visit.     Past Medical History:  Diagnosis Date  . Dyspareunia   . Pelvic pain in female   . PVC's (premature ventricular contractions)   . Seasonal allergies   . Ventricular pre-excitation   . Wears contact lenses     ROS:   All systems reviewed and negative except as noted in the HPI.   Past Surgical History:  Procedure Laterality Date  . BREAST ENHANCEMENT SURGERY  feb  2002  . LAPAROSCOPY N/A 12/13/2014   Procedure: LAPAROSCOPY DIAGNOSTIC ;  Surgeon: Marylynn Pearson, MD;  Location: [facility];  Service: Gynecology;  Laterality: N/A;  . LAPAROSCOPY FOR ECTOPIC PREGNANCY  2009 approx.   Unilateral Salpingostomy     Family History  Problem Relation Age of Onset  . Breast cancer Maternal Grandmother   . Colon cancer Paternal Grandfather   . Diabetes Mother   . Colon polyps Father   . Heart attack Maternal Grandfather      Social History   Socioeconomic History  . Marital status: Married    Spouse name: Not on file  . Number of children: 2  . Years of education: Not on file  . Highest education level: Not on file  Occupational History  . Occupation: work from home  Tobacco Use  . Smoking status: Never Smoker  . Smokeless tobacco: Never Used  Vaping Use  . Vaping Use: Never used  Substance and Sexual Activity  . Alcohol use: Yes     Alcohol/week: 7.0 standard drinks    Types: 7 Glasses of wine per week    Comment: 1-2 DAILY WINE  . Drug use: No  . Sexual activity: Yes    Birth control/protection: Pill  Other Topics Concern  . Not on file  Social History Narrative  . Not on file   Social Determinants of Health   Financial Resource Strain:   . Difficulty of Paying Living Expenses: Not on file  Food Insecurity:   . Worried About Charity fundraiser in the Last Year: Not on file  . Ran Out of Food in the Last Year: Not on file  Transportation Needs:   . Lack of Transportation (Medical): Not on file  . Lack of Transportation (Non-Medical): Not on file  Physical Activity:   . Days of Exercise per Week: Not on file  . Minutes of Exercise per Session: Not on file  Stress:   . Feeling of Stress : Not on file  Social Connections:   . Frequency of Communication with Friends and Family: Not on file  . Frequency of Social Gatherings with Friends and Family: Not on file  . Attends Religious Services: Not on file  . Active Member of Clubs or Organizations: Not on file  . Attends Archivist Meetings: Not on file  . Marital Status: Not on  file  Intimate Partner Violence:   . Fear of Current or Ex-Partner: Not on file  . Emotionally Abused: Not on file  . Physically Abused: Not on file  . Sexually Abused: Not on file     BP 116/72   Pulse 70   Ht 5' 8"  (1.727 m)   Wt 145 lb (65.8 kg)   SpO2 96%   BMI 22.05 kg/m   Physical Exam:  Well appearing NAD HEENT: Unremarkable Neck:  No JVD, no thyromegally Lymphatics:  No adenopathy Back:  No CVA tenderness Lungs:  Clear with no wheezes HEART:  Regular rate rhythm, no murmurs, no rubs, no clicks Abd:  soft, positive bowel sounds, no organomegally, no rebound, no guarding Ext:  2 plus pulses, no edema, no cyanosis, no clubbing Skin:  No rashes no nodules Neuro:  CN II through XII intact, motor grossly intact  EKG - NSR with PVC's and manifest  ventricular pre-excitation  Assess/Plan: 1. WPW - we discussed the treatment options. She is interested in ablation but her insurance will not provide the coverage that she needs. We discussed watchful waiting as well as flecainide and we will start low dose flecainide. 2. PVC's - she is symptomatic from these as well. She will start flecainide.  I spent 30 minutes with 50% face time with this patient.  Carleene Overlie Donovin Kraemer,MD

## 2020-03-21 NOTE — Patient Instructions (Addendum)
Medication Instructions:  Your physician has recommended you make the following change in your medication:   1.  Start taking flecainide 50 mg- Take one tablet by mouth twice a day  Labwork: None ordered.  Testing/Procedures:  You will come to the [facility] office in 2 weeks for a nurse visit for an EKG.  April 04, 2020 at 1:00 pm  Follow-Up: Your physician wants you to follow-up in: 3 months with Dr. Lovena Le.    June 24, 2020 at 10:30 am at the [facility] office  Any Other Special Instructions Will Be Listed Below (If Applicable).  If you need a refill on your cardiac medications before your next appointment, please call your pharmacy.   Flecainide tablets What is this medicine? FLECAINIDE (FLEK a nide) is an antiarrhythmic drug. This medicine is used to prevent irregular heart rhythm. It can also slow down fast heartbeats called tachycardia. This medicine may be used for other purposes; ask your health care provider or pharmacist if you have questions. COMMON BRAND NAME(S): Tambocor What should I tell my health care provider before I take this medicine? They need to know if you have any of these conditions:  abnormal levels of potassium in the blood  heart disease including heart rhythm and heart rate problems  kidney or liver disease  recent heart attack  an unusual or allergic reaction to flecainide, local anesthetics, other medicines, foods, dyes, or preservatives  pregnant or trying to get pregnant  breast-feeding How should I use this medicine? Take this medicine by mouth with a glass of water. Follow the directions on the prescription label. You can take this medicine with or without food. Take your doses at regular intervals. Do not take your medicine more often than directed. Do not stop taking this medicine suddenly. This may cause serious, heart-related side effects. If your doctor wants you to stop the medicine, the dose may be slowly lowered over time to  avoid any side effects. Talk to your pediatrician regarding the use of this medicine in children. While this drug may be prescribed for children as young as 1 year of age for selected conditions, precautions do apply. Overdosage: If you think you have taken too much of this medicine contact a poison control center or emergency room at once. NOTE: This medicine is only for you. Do not share this medicine with others. What if I miss a dose? If you miss a dose, take it as soon as you can. If it is almost time for your next dose, take only that dose. Do not take double or extra doses. What may interact with this medicine? Do not take this medicine with any of the following medications:  amoxapine  arsenic trioxide  certain antibiotics like clarithromycin, erythromycin, gatifloxacin, gemifloxacin, levofloxacin, moxifloxacin, sparfloxacin, or troleandomycin  certain antidepressants called tricyclic antidepressants like amitriptyline, imipramine, or nortriptyline  certain medicines to control heart rhythm like disopyramide, encainide, moricizine, procainamide, propafenone, and quinidine  cisapride  delavirdine  droperidol  haloperidol  hawthorn  imatinib  levomethadyl  maprotiline  medicines for malaria like chloroquine and halofantrine  pentamidine  phenothiazines like chlorpromazine, mesoridazine, prochlorperazine, thioridazine  pimozide  quinine  ranolazine  ritonavir  sertindole This medicine may also interact with the following medications:  cimetidine  dofetilide  medicines for angina or high blood pressure  medicines to control heart rhythm like amiodarone and digoxin  ziprasidone This list may not describe all possible interactions. Give your health care provider a list of all the  medicines, herbs, non-prescription drugs, or dietary supplements you use. Also tell them if you smoke, drink alcohol, or use illegal drugs. Some items may interact with your  medicine. What should I watch for while using this medicine? Visit your doctor or health care professional for regular checks on your progress. Because your condition and the use of this medicine carries some risk, it is a good idea to carry an identification card, necklace or bracelet with details of your condition, medications and doctor or health care professional. Check your blood pressure and pulse rate regularly. Ask your health care professional what your blood pressure and pulse rate should be, and when you should contact him or her. Your doctor or health care professional also may schedule regular blood tests and electrocardiograms to check your progress. You may get drowsy or dizzy. Do not drive, use machinery, or do anything that needs mental alertness until you know how this medicine affects you. Do not stand or sit up quickly, especially if you are an older patient. This reduces the risk of dizzy or fainting spells. Alcohol can make you more dizzy, increase flushing and rapid heartbeats. Avoid alcoholic drinks. What side effects may I notice from receiving this medicine? Side effects that you should report to your doctor or health care professional as soon as possible:  chest pain, continued irregular heartbeats  difficulty breathing  swelling of the legs or feet  trembling, shaking  unusually weak or tired Side effects that usually do not require medical attention (report to your doctor or health care professional if they continue or are bothersome):  blurred vision  constipation  headache  nausea, vomiting  stomach pain This list may not describe all possible side effects. Call your doctor for medical advice about side effects. You may report side effects to FDA at 1-800-FDA-1088. Where should I keep my medicine? Keep out of the reach of children. Store at room temperature between 15 and 30 degrees C (59 and 86 degrees F). Protect from light. Keep container tightly  closed. Throw away any unused medicine after the expiration date. NOTE: This sheet is a summary. It may not cover all possible information. If you have questions about this medicine, talk to your doctor, pharmacist, or health care provider.  2020 Elsevier/Gold Standard (2018-05-29 11:41:38)

## 2020-03-31 ENCOUNTER — Ambulatory Visit: Payer: PRIVATE HEALTH INSURANCE | Admitting: Student

## 2020-04-04 ENCOUNTER — Other Ambulatory Visit: Payer: Self-pay

## 2020-04-04 ENCOUNTER — Ambulatory Visit (INDEPENDENT_AMBULATORY_CARE_PROVIDER_SITE_OTHER): Payer: No Typology Code available for payment source

## 2020-04-04 VITALS — HR 81

## 2020-04-04 DIAGNOSIS — I471 Supraventricular tachycardia: Secondary | ICD-10-CM

## 2020-04-04 DIAGNOSIS — I493 Ventricular premature depolarization: Secondary | ICD-10-CM | POA: Diagnosis not present

## 2020-04-04 DIAGNOSIS — I456 Pre-excitation syndrome: Secondary | ICD-10-CM | POA: Diagnosis not present

## 2020-04-04 MED ORDER — FLECAINIDE ACETATE 50 MG PO TABS: 75.0000 mg | ORAL_TABLET | Freq: Two times a day (BID) | ORAL | 3 refills | Status: DC

## 2020-04-04 NOTE — Progress Notes (Signed)
1.) Reason for visit: new start flecainide  2.) Name of MD requesting visit: Dr. Lovena Le  3.) H&P: Pt has WPW.  Started on flecainide 50 mg PO BID.  4.) ROS related to problem: Pt seen for increased palpitations.  She was started on flecainide 50 mg PO BID on March 21, 2020.  Pt states she has felt better on the flecainide.  She thinks her palpitations have decreased in frequency.    5.) Assessment and plan per MD: EKG preliminarily reviewed by AT.  Pt continue to have frequent PVC's on EKG.  EKG is stable on flecainide.  Per AT will increase flecainide to 75 mg BID PO.  EKG and medication change communicated to Dr. Lovena Le.  Will notify Pt when she needs follow up EKG.

## 2020-04-04 NOTE — Patient Instructions (Signed)
Medication Instructions:  Your physician has recommended you make the following change in your medication:   1.  INcrease your flecainide 50 mg--Take 1.5 tablets by mouth twice a day   Labwork: None ordered.  Testing/Procedures: None ordered.  Follow-Up:  EKG- to be determined  Any Other Special Instructions Will Be Listed Below (If Applicable).  If you need a refill on your cardiac medications before your next appointment, please call your pharmacy.

## 2020-04-14 ENCOUNTER — Telehealth: Payer: Self-pay

## 2020-04-14 NOTE — Telephone Encounter (Signed)
Left detailed message requesting call back.  Need to schedule nurse visit for EKG.  Would like to schedule November 1 on a day GT in office.

## 2020-04-16 NOTE — Telephone Encounter (Signed)
Pt scheduled for nurse visit 04/21/20

## 2020-04-21 ENCOUNTER — Other Ambulatory Visit: Payer: Self-pay

## 2020-04-21 ENCOUNTER — Ambulatory Visit (INDEPENDENT_AMBULATORY_CARE_PROVIDER_SITE_OTHER): Payer: No Typology Code available for payment source

## 2020-04-21 VITALS — HR 77 | Ht 68.0 in

## 2020-04-21 DIAGNOSIS — I493 Ventricular premature depolarization: Secondary | ICD-10-CM | POA: Diagnosis not present

## 2020-04-21 DIAGNOSIS — I456 Pre-excitation syndrome: Secondary | ICD-10-CM | POA: Diagnosis not present

## 2020-04-21 NOTE — Progress Notes (Signed)
1.) Reason for visit: EKG follow up after increase to flecainide 75 mg BID  2.) Name of MD requesting visit: Dr. Lovena Le  3.) H&P: flecainide increased to 75 mg BID at last nurse visit d/t continued PVC's/palpitations  4.) ROS related to problem: Pt states since she has increased flecainide to 75 mg bid she has had some dizziness and "a buzzy feeling in my head"  5.) Assessment and plan per MD: Per Dr. Raiford Noble continue flecainide 75 mg BID for another week to see if any improvement in symptoms

## 2020-04-21 NOTE — Patient Instructions (Addendum)
Medication Instructions:  Your physician recommends that you continue on your current medications as directed. Please refer to the Current Medication list given to you today.  Labwork: None ordered.  Testing/Procedures: None ordered.  Follow-Up:  Continue flecainide one more week and let me know how you are doing.  Any Other Special Instructions Will Be Listed Below (If Applicable).  If you need a refill on your cardiac medications before your next appointment, please call your pharmacy.

## 2020-05-20 ENCOUNTER — Other Ambulatory Visit: Payer: Self-pay

## 2020-05-20 ENCOUNTER — Ambulatory Visit (INDEPENDENT_AMBULATORY_CARE_PROVIDER_SITE_OTHER): Payer: No Typology Code available for payment source | Admitting: Internal Medicine

## 2020-05-20 ENCOUNTER — Encounter: Payer: Self-pay | Admitting: Internal Medicine

## 2020-05-20 VITALS — BP 110/80 | HR 76 | Ht 68.0 in | Wt 147.0 lb

## 2020-05-20 DIAGNOSIS — I456 Pre-excitation syndrome: Secondary | ICD-10-CM

## 2020-05-20 DIAGNOSIS — I493 Ventricular premature depolarization: Secondary | ICD-10-CM | POA: Diagnosis not present

## 2020-05-20 MED ORDER — FLECAINIDE ACETATE 100 MG PO TABS: 100.0000 mg | ORAL_TABLET | Freq: Two times a day (BID) | ORAL | 3 refills | Status: DC

## 2020-05-20 NOTE — Progress Notes (Signed)
HPI Ms. Mancillas returns today for evaluation of chest pain. She is a pleasant 44 yo woman with WPW who has been treated with medical therapy. She has a lot of cardiac awareness. Her ventricular pre-excitation is markedly reduced on flecainide 75 bid. She has continued to have occasional palpitations but no heart racing. She has occassions of non-exertional chest pain which lasts seconds.  No Known Allergies   Current Outpatient Medications  Medication Sig Dispense Refill  . flecainide (TAMBOCOR) 50 MG tablet Take 1.5 tablets (75 mg total) by mouth 2 (two) times daily. 270 tablet 3  . KURVELO 0.15-30 MG-MCG tablet Take 1 tablet by mouth daily.     No current facility-administered medications for this visit.     Past Medical History:  Diagnosis Date  . Dyspareunia   . Pelvic pain in female   . PVC's (premature ventricular contractions)   . Seasonal allergies   . Ventricular pre-excitation   . Wears contact lenses     ROS:   All systems reviewed and negative except as noted in the HPI.   Past Surgical History:  Procedure Laterality Date  . BREAST ENHANCEMENT SURGERY  feb  2002  . LAPAROSCOPY N/A 12/13/2014   Procedure: LAPAROSCOPY DIAGNOSTIC ;  Surgeon: Marylynn Pearson, MD;  Location: [facility];  Service: Gynecology;  Laterality: N/A;  . LAPAROSCOPY FOR ECTOPIC PREGNANCY  2009 approx.   Unilateral Salpingostomy     Family History  Problem Relation Age of Onset  . Breast cancer Maternal Grandmother   . Colon cancer Paternal Grandfather   . Diabetes Mother   . Colon polyps Father   . Heart attack Maternal Grandfather      Social History   Socioeconomic History  . Marital status: Married    Spouse name: Not on file  . Number of children: 2  . Years of education: Not on file  . Highest education level: Not on file  Occupational History  . Occupation: work from home  Tobacco Use  . Smoking status: Never Smoker  . Smokeless tobacco: Never  Used  Vaping Use  . Vaping Use: Never used  Substance and Sexual Activity  . Alcohol use: Yes    Alcohol/week: 7.0 standard drinks    Types: 7 Glasses of wine per week    Comment: 1-2 DAILY WINE  . Drug use: No  . Sexual activity: Yes    Birth control/protection: Pill  Other Topics Concern  . Not on file  Social History Narrative  . Not on file   Social Determinants of Health   Financial Resource Strain:   . Difficulty of Paying Living Expenses: Not on file  Food Insecurity:   . Worried About Charity fundraiser in the Last Year: Not on file  . Ran Out of Food in the Last Year: Not on file  Transportation Needs:   . Lack of Transportation (Medical): Not on file  . Lack of Transportation (Non-Medical): Not on file  Physical Activity:   . Days of Exercise per Week: Not on file  . Minutes of Exercise per Session: Not on file  Stress:   . Feeling of Stress : Not on file  Social Connections:   . Frequency of Communication with Friends and Family: Not on file  . Frequency of Social Gatherings with Friends and Family: Not on file  . Attends Religious Services: Not on file  . Active Member of Clubs or Organizations: Not on file  .  Attends Archivist Meetings: Not on file  . Marital Status: Not on file  Intimate Partner Violence:   . Fear of Current or Ex-Partner: Not on file  . Emotionally Abused: Not on file  . Physically Abused: Not on file  . Sexually Abused: Not on file     BP 110/80   Pulse 76   Ht 5' 8"  (1.727 m)   Wt 147 lb (66.7 kg)   SpO2 98%   BMI 22.35 kg/m   Physical Exam:  Well appearing NAD HEENT: Unremarkable Neck:  No JVD, no thyromegally Lymphatics:  No adenopathy Back:  No CVA tenderness Lungs:  Clear with no wheezes HEART:  Regular rate rhythm, no murmurs, no rubs, no clicks Abd:  soft, positive bowel sounds, no organomegally, no rebound, no guarding Ext:  2 plus pulses, no edema, no cyanosis, no clubbing Skin:  No rashes no  nodules Neuro:  CN II through XII intact, motor grossly intact  EKG - NSR with subtle ventricular pre-excitation.    Assess/Plan: 1. Non-cardiac chest pain - I think most likely esophageal spasm. I have asked her to call us if it progresses and we will try ant-acids. 2. WPW - she is only minimal pre-excitation. She will increase her dose of flecainide to 100 bid. 3. PVC's - she has had some symptomatic pvc's and we will increase flecainide and return in 2 weeks for an ECG.  Carleene Overlie Escarlet Saathoff,MD

## 2020-05-20 NOTE — Patient Instructions (Addendum)
Medication Instructions:  Your physician has recommended you make the following change in your medication:   1.  INCREASE your flecainide--Take 100 mg by mouth twice a day  Labwork: None ordered.  Testing/Procedures: None ordered.  Follow-Up:  Nurse visit EKG- June 04, 2020 at 2:00 pm  Your physician wants you to follow-up in: 3 months with Dr. Lovena Le.    August 22, 2019 at 9:45 am   Any Other Special Instructions Will Be Listed Below (If Applicable).  If you need a refill on your cardiac medications before your next appointment, please call your pharmacy.

## 2020-05-26 MED ORDER — OMEPRAZOLE 20 MG PO CPDR: 40.0000 mg | DELAYED_RELEASE_CAPSULE | Freq: Every day | ORAL | 11 refills | Status: DC

## 2020-06-04 ENCOUNTER — Other Ambulatory Visit: Payer: Self-pay

## 2020-06-04 ENCOUNTER — Ambulatory Visit (INDEPENDENT_AMBULATORY_CARE_PROVIDER_SITE_OTHER): Payer: No Typology Code available for payment source

## 2020-06-04 VITALS — HR 77 | Ht 68.0 in

## 2020-06-04 DIAGNOSIS — I493 Ventricular premature depolarization: Secondary | ICD-10-CM

## 2020-06-04 DIAGNOSIS — I456 Pre-excitation syndrome: Secondary | ICD-10-CM | POA: Diagnosis not present

## 2020-06-04 NOTE — Progress Notes (Signed)
1. Reason for visit: EKG follow up after increase to flecainide 100 mg BID  2. Name of MD requesting visit: Dr. Lovena Le  3. H&P: flecainide increased to 100 mg BID at last nurse visit d/t continued PVC's/palpitations  4. ROS related to problem: Pt states she has been doing well since increase to 100 mg BID.  She had one time where she felt bad, heart was racing and Pt felt anxious.  Upon further reflection she thinks she missed a dose of her flecainide.  She also feels improved since starting Prilosec.  5. Assessment and plan per MD: EKG reviewed by DOD and stable.  Pt has follow up scheduled with Dr. Lovena Le.

## 2020-06-04 NOTE — Patient Instructions (Signed)
Follow up as scheduled.  

## 2020-06-24 ENCOUNTER — Ambulatory Visit: Payer: No Typology Code available for payment source | Admitting: Internal Medicine

## 2020-08-21 ENCOUNTER — Other Ambulatory Visit: Payer: Self-pay

## 2020-08-21 ENCOUNTER — Encounter: Payer: Self-pay | Admitting: Internal Medicine

## 2020-08-21 ENCOUNTER — Ambulatory Visit (INDEPENDENT_AMBULATORY_CARE_PROVIDER_SITE_OTHER): Payer: No Typology Code available for payment source | Admitting: Internal Medicine

## 2020-08-21 VITALS — BP 122/78 | HR 72 | Ht 68.0 in | Wt 149.2 lb

## 2020-08-21 DIAGNOSIS — I456 Pre-excitation syndrome: Secondary | ICD-10-CM

## 2020-08-21 NOTE — Progress Notes (Signed)
HPI Dana Graves returns today for followup. She is a pleasant 45 yo woman with a h/o WPW syndrome who has elected to be treated with medical therapy. She has been on flecainide. In the interim, she notes that she still has occaisional episodes of chest pain not associated with PVC's or heart racing or activity.  No Known Allergies   Current Outpatient Medications  Medication Sig Dispense Refill  . flecainide (TAMBOCOR) 100 MG tablet Take 1 tablet (100 mg total) by mouth 2 (two) times daily. 180 tablet 3  . KURVELO 0.15-30 MG-MCG tablet Take 1 tablet by mouth daily.    Marland Kitchen omeprazole (PRILOSEC) 20 MG capsule Take 2 capsules (40 mg total) by mouth daily. 60 capsule 11   No current facility-administered medications for this visit.     Past Medical History:  Diagnosis Date  . Dyspareunia   . Pelvic pain in female   . PVC's (premature ventricular contractions)   . Seasonal allergies   . Ventricular pre-excitation   . Wears contact lenses     ROS:   All systems reviewed and negative except as noted in the HPI.   Past Surgical History:  Procedure Laterality Date  . BREAST ENHANCEMENT SURGERY  feb  2002  . LAPAROSCOPY N/A 12/13/2014   Procedure: LAPAROSCOPY DIAGNOSTIC ;  Surgeon: Marylynn Pearson, MD;  Location: [facility];  Service: Gynecology;  Laterality: N/A;  . LAPAROSCOPY FOR ECTOPIC PREGNANCY  2009 approx.   Unilateral Salpingostomy     Family History  Problem Relation Age of Onset  . Breast cancer Maternal Grandmother   . Colon cancer Paternal Grandfather   . Diabetes Mother   . Colon polyps Father   . Heart attack Maternal Grandfather      Social History   Socioeconomic History  . Marital status: Married    Spouse name: Not on file  . Number of children: 2  . Years of education: Not on file  . Highest education level: Not on file  Occupational History  . Occupation: work from home  Tobacco Use  . Smoking status: Never Smoker  .  Smokeless tobacco: Never Used  Vaping Use  . Vaping Use: Never used  Substance and Sexual Activity  . Alcohol use: Yes    Alcohol/week: 7.0 standard drinks    Types: 7 Glasses of wine per week    Comment: 1-2 DAILY WINE  . Drug use: No  . Sexual activity: Yes    Birth control/protection: Pill  Other Topics Concern  . Not on file  Social History Narrative  . Not on file   Social Determinants of Health   Financial Resource Strain: Not on file  Food Insecurity: Not on file  Transportation Needs: Not on file  Physical Activity: Not on file  Stress: Not on file  Social Connections: Not on file  Intimate Partner Violence: Not on file     BP 122/78   Pulse 72   Ht 5' 8"  (1.727 m)   Wt 149 lb 3.2 oz (67.7 kg)   SpO2 97%   BMI 22.69 kg/m   Physical Exam:  Well appearing NAD HEENT: Unremarkable Neck:  No JVD, no thyromegally Lymphatics:  No adenopathy Back:  No CVA tenderness Lungs:  Clear HEART:  Regular rate rhythm, no murmurs, no rubs, no clicks Abd:  soft, positive bowel sounds, no organomegally, no rebound, no guarding Ext:  2 plus pulses, no edema, no cyanosis, no clubbing Skin:  No rashes  no nodules Neuro:  CN II through XII intact, motor grossly intact  EKG - nsr with PVC's and no pre-excitation   Assess/Plan: 1. WPW - she has no evidence of pre-excitation on her ECG and is having no SVT on flecainide. We will continue. 2. PVC' s- she is minimally symptomatic.  3. Chest pain - her pain is non-cardiac and I have reassured her.  Carleene Overlie Emira Eubanks,MD

## 2020-08-21 NOTE — Patient Instructions (Signed)
Medication Instructions:  Your physician recommends that you continue on your current medications as directed. Please refer to the Current Medication list given to you today.  Labwork: None ordered.  Testing/Procedures: None ordered.  Follow-Up: Your physician wants you to follow-up in: one year with Cristopher Peru, MD or one of the following Advanced Practice Providers on your designated Care Team:    Chanetta Marshall, NP  Tommye Standard, PA-C  Legrand Como "Jonni Sanger" Chalmers Cater, [place]   Any Other Special Instructions Will Be Listed Below (If Applicable).  If you need a refill on your cardiac medications before your next appointment, please call your pharmacy.

## 2020-08-29 DIAGNOSIS — E538 Deficiency of other specified B group vitamins: Secondary | ICD-10-CM | POA: Insufficient documentation

## 2020-09-10 MED ORDER — PROPAFENONE HCL 225 MG PO TABS: 225.0000 mg | ORAL_TABLET | Freq: Two times a day (BID) | ORAL | 11 refills | Status: DC

## 2020-10-14 ENCOUNTER — Other Ambulatory Visit: Payer: Self-pay

## 2020-10-14 ENCOUNTER — Ambulatory Visit (INDEPENDENT_AMBULATORY_CARE_PROVIDER_SITE_OTHER)
Admission: RE | Admit: 2020-10-14 | Discharge: 2020-10-14 | Disposition: A | Discharge status: Home / Self Care | Payer: Self-pay | Source: Ambulatory Visit | Attending: Internal Medicine | Admitting: Internal Medicine

## 2020-10-14 DIAGNOSIS — I456 Pre-excitation syndrome: Secondary | ICD-10-CM

## 2020-10-20 ENCOUNTER — Telehealth: Payer: Self-pay | Admitting: Internal Medicine

## 2020-10-20 NOTE — Telephone Encounter (Signed)
PT is returning a call in regards to her lab results.Please advise

## 2020-10-20 NOTE — Telephone Encounter (Signed)
[facility] patient.    Evans Lance, MD  10/19/2020 8:38 PM EDT      Great news. A calcium score or zero indicates that she does not have coronary atherosclerosis. Consider followup with GI.    Pt verbalized understanding and had no questions or concerns at this time.

## 2020-10-22 ENCOUNTER — Ambulatory Visit: Payer: No Typology Code available for payment source

## 2020-10-27 ENCOUNTER — Telehealth: Payer: Self-pay

## 2020-10-27 MED ORDER — FLECAINIDE ACETATE 100 MG PO TABS: 100.0000 mg | ORAL_TABLET | Freq: Two times a day (BID) | ORAL | 3 refills | Status: DC

## 2020-10-27 NOTE — Telephone Encounter (Signed)
Pt previously stopped propafenone.  Then she chose not to take anything for a couple of weeks.  Now she is worried about her PVC's so sent a mychart message today advising she had restarted her flecainide 100 mg BID.  Readded to her med list.  She will come in for an EKG may 23.

## 2020-11-10 ENCOUNTER — Ambulatory Visit (INDEPENDENT_AMBULATORY_CARE_PROVIDER_SITE_OTHER): Payer: No Typology Code available for payment source

## 2020-11-10 ENCOUNTER — Other Ambulatory Visit: Payer: Self-pay

## 2020-11-10 VITALS — HR 71 | Ht 68.0 in

## 2020-11-10 DIAGNOSIS — I493 Ventricular premature depolarization: Secondary | ICD-10-CM | POA: Diagnosis not present

## 2020-11-10 NOTE — Progress Notes (Signed)
1.) Reason for visit: Restart flecainide 100 mg BID PO  2.) Name of MD requesting visit: Dr. Lovena Le  3.) H&P:  Pt restarted flecainide  4.) ROS related to problem: Pt states her palpitations have resolved back on the flecainide.  5.) Assessment and plan per MD: EKG primarily reviewed by EP APP.  Will have Dr. Lovena Le review and advise.

## 2020-11-10 NOTE — Patient Instructions (Signed)
Follow up as scheduled.  

## 2020-12-03 ENCOUNTER — Telehealth: Payer: Self-pay | Admitting: *Deleted

## 2020-12-03 NOTE — Telephone Encounter (Signed)
Called pt to further discuss. Pt is going to try Nexium to see if improvement. Advised to go to ED if feels necessary, secondary to CP but she has unfortunately been dealing with this for years. Pt will call back if she needs anything prior to next week.  Aware we do not have availability this week to see her. Aware Sonia Baller, RN will follow up with her next week to further discuss a plan of care with her. Patient verbalized understanding and agreeable to plan.   She appreciates my call and apologizes for all the messages.

## 2020-12-03 NOTE — Telephone Encounter (Signed)
Error

## 2020-12-18 MED ORDER — VERAPAMIL HCL ER 120 MG PO TBCR: 120.0000 mg | EXTENDED_RELEASE_TABLET | Freq: Every day | ORAL | 11 refills | Status: DC

## 2020-12-18 NOTE — Addendum Note (Signed)
Addended by: Willeen Cass A on: 12/18/2020 03:54 PM   Modules accepted: Orders

## 2021-02-13 ENCOUNTER — Encounter: Payer: Self-pay | Admitting: Internal Medicine

## 2021-02-16 ENCOUNTER — Ambulatory Visit
Admission: RE | Admit: 2021-02-16 | Discharge: 2021-02-16 | Disposition: A | Discharge status: Home / Self Care | Payer: PRIVATE HEALTH INSURANCE | Attending: Internal Medicine | Admitting: Internal Medicine

## 2021-02-16 ENCOUNTER — Ambulatory Visit: Payer: PRIVATE HEALTH INSURANCE | Admitting: Registered Nurse

## 2021-02-16 ENCOUNTER — Encounter
Admission: RE | Disposition: A | Discharge status: Home / Self Care | Payer: Self-pay | Source: Home / Self Care | Attending: Internal Medicine

## 2021-02-16 ENCOUNTER — Encounter: Payer: Self-pay | Admitting: Internal Medicine

## 2021-02-16 DIAGNOSIS — Z79899 Other long term (current) drug therapy: Secondary | ICD-10-CM | POA: Diagnosis not present

## 2021-02-16 DIAGNOSIS — K2289 Other specified disease of esophagus: Secondary | ICD-10-CM | POA: Insufficient documentation

## 2021-02-16 DIAGNOSIS — R0789 Other chest pain: Secondary | ICD-10-CM | POA: Insufficient documentation

## 2021-02-16 DIAGNOSIS — K449 Diaphragmatic hernia without obstruction or gangrene: Secondary | ICD-10-CM | POA: Diagnosis not present

## 2021-02-16 DIAGNOSIS — K219 Gastro-esophageal reflux disease without esophagitis: Secondary | ICD-10-CM | POA: Insufficient documentation

## 2021-02-16 HISTORY — PX: ESOPHAGOGASTRODUODENOSCOPY: SHX5428

## 2021-02-16 HISTORY — DX: Pre-excitation syndrome: I45.6

## 2021-02-16 HISTORY — DX: Deficiency of other specified B group vitamins: E53.8

## 2021-02-16 LAB — POCT PREGNANCY, URINE: Preg Test, Ur: NEGATIVE

## 2021-02-16 SURGERY — EGD (ESOPHAGOGASTRODUODENOSCOPY)
Anesthesia: General

## 2021-02-16 MED ORDER — SODIUM CHLORIDE 0.9 % IV SOLN: INTRAVENOUS | Status: DC

## 2021-02-16 MED ORDER — PROPOFOL 500 MG/50ML IV EMUL
INTRAVENOUS | Status: DC | PRN
  Administered 2021-02-16: 150 ug/kg/min via INTRAVENOUS

## 2021-02-16 MED ORDER — PROPOFOL 10 MG/ML IV BOLUS
INTRAVENOUS | Status: DC | PRN
  Administered 2021-02-16: 20 mg via INTRAVENOUS
  Administered 2021-02-16: 70 mg via INTRAVENOUS

## 2021-02-16 MED ORDER — LIDOCAINE HCL (CARDIAC) PF 100 MG/5ML IV SOSY
PREFILLED_SYRINGE | INTRAVENOUS | Status: DC | PRN
  Administered 2021-02-16: 80 mg via INTRAVENOUS

## 2021-02-16 NOTE — Anesthesia Preprocedure Evaluation (Signed)
Anesthesia Evaluation  Patient identified by MRN, date of birth, ID band Patient awake    Reviewed: Allergy & Precautions, H&P , NPO status , Patient's Chart, lab work & pertinent test results, reviewed documented beta blocker date and time   Airway Mallampati: II   Neck ROM: full    Dental  (+) Poor Dentition   Pulmonary neg pulmonary ROS,    Pulmonary exam normal        Cardiovascular Exercise Tolerance: Good Normal cardiovascular exam+ dysrhythmias  Rhythm:regular Rate:Normal     Neuro/Psych negative neurological ROS  negative psych ROS   GI/Hepatic negative GI ROS, Neg liver ROS,   Endo/Other  negative endocrine ROS  Renal/GU negative Renal ROS  negative genitourinary   Musculoskeletal   Abdominal   Peds  Hematology negative hematology ROS (+)   Anesthesia Other Findings Past Medical History: No date: B12 deficiency No date: Dyspareunia No date: Pelvic pain in female No date: PVC's (premature ventricular contractions) No date: Seasonal allergies No date: Ventricular pre-excitation No date: Wears contact lenses No date: WPW (Wolff-Parkinson-White syndrome) Past Surgical History: feb  2002: BREAST ENHANCEMENT SURGERY 12/13/2014: LAPAROSCOPY; N/A     Comment:  Procedure: LAPAROSCOPY DIAGNOSTIC ;  Surgeon: Marylynn Pearson, MD;  Location: [place];                Service: Gynecology;  Laterality: N/A; 2009 approx.: LAPAROSCOPY FOR ECTOPIC PREGNANCY     Comment:  Unilateral Salpingostomy BMI    Body Mass Index: 23.57 kg/m     Reproductive/Obstetrics negative OB ROS                             Anesthesia Physical Anesthesia Plan  ASA: 3  Anesthesia Plan: General   Post-op Pain Management:    Induction:   PONV Risk Score and Plan:   Airway Management Planned:   Additional Equipment:   Intra-op Plan:   Post-operative Plan:   Informed  Consent: I have reviewed the patients History and Physical, chart, labs and discussed the procedure including the risks, benefits and alternatives for the proposed anesthesia with the patient or authorized representative who has indicated his/her understanding and acceptance.     Dental Advisory Given  Plan Discussed with: CRNA  Anesthesia Plan Comments:         Anesthesia Quick Evaluation

## 2021-02-16 NOTE — Transfer of Care (Signed)
Immediate Anesthesia Transfer of Care Note  Patient: Dana Graves  Procedure(s) Performed: ESOPHAGOGASTRODUODENOSCOPY (EGD)  Patient Location: Endoscopy Unit  Anesthesia Type:General  Level of Consciousness: drowsy  Airway & Oxygen Therapy: Patient Spontanous Breathing  Post-op Assessment: Report given to RN and Post -op Vital signs reviewed and stable  Post vital signs: Reviewed and stable  Last Vitals:  Vitals Value Taken Time  BP 122/58 02/16/21 1451  Temp 36.4 C 02/16/21 1451  Pulse 84 02/16/21 1453  Resp 15 02/16/21 1453  SpO2 99 % 02/16/21 1453  Vitals shown include unvalidated device data.  Last Pain:  Vitals:   02/16/21 1451  TempSrc:   PainSc: 0-No pain         Complications: No notable events documented.

## 2021-02-16 NOTE — Op Note (Signed)
[facility]astroenter[facility] Patient Name: Dana Graves Procedure Date: 02/16/2021 2:22 PM MRN: [id number] Account #: [id number] Date of Birth: [birth year] Admit Type: Outpatient Age: 45 Room: [facility] ENDO ROOM 2 Gender: Female Note Status: Finalized Procedure:             Upper GI endoscopy Indications:           Chest pain (non cardiac) Providers:             Benay Pike. [name] MD, MD Referring MD:          Ramonita Lab, MD (Referring MD) Medicines:             Propofol per Anesthesia Complications:         No immediate complications. Procedure:             Pre-Anesthesia Assessment:                        - The risks and benefits of the procedure and the                         sedation options and risks were discussed with the                         patient. All questions were answered and informed                         consent was obtained.                        - Patient identification and proposed procedure were                         verified prior to the procedure by the nurse. The                         procedure was verified in the procedure room.                        - ASA Grade Assessment: I - A normal, healthy patient.                        - After reviewing the risks and benefits, the patient                         was deemed in satisfactory condition to undergo the                         procedure.                        After obtaining informed consent, the endoscope was                         passed under direct vision. Throughout the procedure,                         the patient's blood pressure, pulse, and oxygen  saturations were monitored continuously. The Endoscope                         was introduced through the mouth, and advanced to the                         third part of duodenum. The upper GI endoscopy was                         accomplished without difficulty. The patient tolerated                          the procedure well. Findings:      The Z-line was irregular and was found at the gastroesophageal junction.       Mucosa was biopsied with a cold forceps for histology. One specimen       bottle was sent to pathology.      No other significant abnormalities were identified in a careful       examination of the esophagus.      A 1 cm hiatal hernia was present.      The examined duodenum was normal.      The exam was otherwise without abnormality. Impression:            - Z-line irregular, at the gastroesophageal junction.                         Biopsied.                        - 1 cm hiatal hernia.                        - Normal examined duodenum.                        - The examination was otherwise normal. Recommendation:        - Patient has a contact number available for                         emergencies. The signs and symptoms of potential                         delayed complications were discussed with the patient.                         Return to normal activities tomorrow. Written                         discharge instructions were provided to the patient.                        - Resume previous diet.                        - Continue present medications.                        - Await pathology results.                        -  Repeat upper endoscopy in 3 years for surveillance                         of Barrett's esophagus.                        - Return to GI office in 3 months.                        - Follow up with Laurine Blazer, PA-C at [facility]. [phone]. Procedure Code(s):     --- Professional ---                        [phone], Esophagogastroduodenoscopy, flexible,                         transoral; with biopsy, single or multiple Diagnosis Code(s):     --- Professional ---                        R07.89, Other chest pain                        K44.9, Diaphragmatic hernia without obstruction or                          gangrene                        K22.8, Other specified diseases of esophagus CPT copyright 2019 American Medical Association. All rights reserved. The codes documented in this report are preliminary and upon coder review may  be revised to meet current compliance requirements. Efrain Sella MD, MD 02/16/2021 2:51:34 PM This report has been signed electronically. Number of Addenda: 0 Note Initiated On: 02/16/2021 2:22 PM Estimated Blood Loss:  Estimated blood loss: none.      [facility]

## 2021-02-16 NOTE — H&P (Signed)
Outpatient short stay form Pre-procedure 02/16/2021 2:22 PM Dana Graves K. [name], M.D.  Primary Physician: Ramonita Lab III, M.D.  Reason for visit:  Atypical chest pain  History of present illness: 45 year old patient with a history of Wolff-Parkinson-White syndrome and SVT presents for atypical chest pain.  Patient describes pain as more of a "sensation" rather than a pain and denies any overt crushing characteristics or pressure.  The discomfort does not radiate and is not caused by activity.  Short trial of pantoprazole 40 mg daily did not seem to appreciably improve symptoms.  She presents for diagnostic EGD.  The patient denies any dysphagia, abdominal pain, hematemesis, anorexia or weight loss.    Current Facility-Administered Medications:    0.9 %  sodium chloride infusion, , Intravenous, Continuous, [place], Benay Pike, MD, Last Rate: 20 mL/hr at 02/16/21 1354, New Bag at 02/16/21 1354  Medications Prior to Admission  Medication Sig Dispense Refill Last Dose   flecainide (TAMBOCOR) 100 MG tablet Take 1 tablet (100 mg total) by mouth 2 (two) times daily. 180 tablet 3 02/16/2021 at 0900   KURVELO 0.15-30 MG-MCG tablet Take 1 tablet by mouth daily.   02/16/2021 at 1000   omeprazole (PRILOSEC) 20 MG capsule Take 2 capsules (40 mg total) by mouth daily. 60 capsule 11 02/16/2021   verapamil (CALAN-SR) 120 MG CR tablet Take 1 tablet (120 mg total) by mouth at bedtime. 30 tablet 11 02/16/2021 at 0900   Cyanocobalamin (VITAMIN B-12 IJ) Inject 1,000 mg as directed. (Patient not taking: Reported on 02/16/2021)   Completed Course   pantoprazole (PROTONIX) 40 MG tablet Take 40 mg by mouth 2 (two) times daily before a meal. (Patient not taking: Reported on 02/16/2021)   Not Taking     No Known Allergies   Past Medical History:  Diagnosis Date   B12 deficiency    Dyspareunia    Pelvic pain in female    PVC's (premature ventricular contractions)    Seasonal allergies    Ventricular pre-excitation     Wears contact lenses    WPW (Wolff-Parkinson-White syndrome)     Review of systems:  Otherwise negative.    Physical Exam  Gen: Alert, oriented. Appears stated age.  HEENT: [place]/AT. PERRLA. Lungs: CTA, no wheezes. CV: RR nl S1, S2. Abd: soft, benign, no masses. BS+ Ext: No edema. Pulses 2+    Planned procedures: Proceed with EGD. The patient understands the nature of the planned procedure, indications, risks, alternatives and potential complications including but not limited to bleeding, infection, perforation, damage to internal organs and possible oversedation/side effects from anesthesia. The patient agrees and gives consent to proceed.  Please refer to procedure notes for findings, recommendations and patient disposition/instructions.     Dana Graves K. [name], M.D. Gastroenterology 02/16/2021  2:22 PM

## 2021-02-16 NOTE — Interval H&P Note (Signed)
History and Physical Interval Note:  02/16/2021 2:24 PM  Dana Graves  has presented today for surgery, with the diagnosis of ATYPICAL CHEST PAIN.  The various methods of treatment have been discussed with the patient and family. After consideration of risks, benefits and other options for treatment, the patient has consented to  Procedure(s): ESOPHAGOGASTRODUODENOSCOPY (EGD) (N/A) as a surgical intervention.  The patient's history has been reviewed, patient examined, no change in status, stable for surgery.  I have reviewed the patient's chart and labs.  Questions were answered to the patient's satisfaction.     [place], [place]

## 2021-02-17 ENCOUNTER — Encounter: Payer: Self-pay | Admitting: Internal Medicine

## 2021-02-17 NOTE — Anesthesia Postprocedure Evaluation (Signed)
Anesthesia Post Note  Patient: TARNISHA KACHMAR  Procedure(s) Performed: ESOPHAGOGASTRODUODENOSCOPY (EGD)  Patient location during evaluation: PACU Anesthesia Type: General Level of consciousness: awake and alert Pain management: pain level controlled Vital Signs Assessment: post-procedure vital signs reviewed and stable Respiratory status: spontaneous breathing, nonlabored ventilation, respiratory function stable and patient connected to nasal cannula oxygen Cardiovascular status: blood pressure returned to baseline and stable Postop Assessment: no apparent nausea or vomiting Anesthetic complications: no   No notable events documented.   Last Vitals:  Vitals:   02/16/21 1346 02/16/21 1451  BP: 129/72 (!) 122/58  Pulse: 82   Resp: 18   Temp: 36.4 C (!) 36.4 C  SpO2: 100%     Last Pain:  Vitals:   02/17/21 0745  TempSrc:   PainSc: 0-No pain                 Molli Barrows

## 2021-02-18 LAB — SURGICAL PATHOLOGY

## 2021-02-24 ENCOUNTER — Other Ambulatory Visit: Payer: Self-pay | Admitting: Obstetrics and Gynecology

## 2021-02-24 DIAGNOSIS — R928 Other abnormal and inconclusive findings on diagnostic imaging of breast: Secondary | ICD-10-CM

## 2021-03-03 NOTE — Progress Notes (Signed)
PCP:  Adin Hector, MD Primary Cardiologist: Kate Sable, MD Electrophysiologist: Cristopher Peru, MD   Dana Graves is a 45 y.o. female seen today for Cristopher Peru, MD for routine electrophysiology followup.  Since last being seen in our clinic the patient reports doing OK. She has had increased palpitations in the after for the past several weeks, usually starting after 1400-1500. She moved her verapamil to morning to see if this would help. Had EGD last month + for GERD, on omeprazole. (Protonix made her sick). She also has dizziness with rapid head turning, including looking up quickly or rolling over in bed.  Insurance has previously and again recently turned her down for ablation consideration. No exertional chest pain.  Past Medical History:  Diagnosis Date   B12 deficiency    Dyspareunia    Pelvic pain in female    PVC's (premature ventricular contractions)    Seasonal allergies    Ventricular pre-excitation    Wears contact lenses    WPW (Wolff-Parkinson-White syndrome)    Past Surgical History:  Procedure Laterality Date   BREAST ENHANCEMENT SURGERY  feb  2002   ESOPHAGOGASTRODUODENOSCOPY N/A 02/16/2021   Procedure: ESOPHAGOGASTRODUODENOSCOPY (EGD);  Surgeon: [place], Benay Pike, MD;  Location: [facility];  Service: Gastroenterology;  Laterality: N/A;   LAPAROSCOPY N/A 12/13/2014   Procedure: LAPAROSCOPY DIAGNOSTIC ;  Surgeon: Marylynn Pearson, MD;  Location: [facility];  Service: Gynecology;  Laterality: N/A;   LAPAROSCOPY FOR ECTOPIC PREGNANCY  2009 approx.   Unilateral Salpingostomy    Current Outpatient Medications  Medication Sig Dispense Refill   Cyanocobalamin (VITAMIN B-12 IJ) Inject 1,000 mg as directed. (Patient not taking: Reported on 02/16/2021)     flecainide (TAMBOCOR) 100 MG tablet Take 1 tablet (100 mg total) by mouth 2 (two) times daily. 180 tablet 3   KURVELO 0.15-30 MG-MCG tablet Take 1 tablet by mouth daily.      omeprazole (PRILOSEC) 20 MG capsule Take 2 capsules (40 mg total) by mouth daily. 60 capsule 11   pantoprazole (PROTONIX) 40 MG tablet Take 40 mg by mouth 2 (two) times daily before a meal. (Patient not taking: Reported on 02/16/2021)     verapamil (CALAN-SR) 120 MG CR tablet Take 1 tablet (120 mg total) by mouth at bedtime. 30 tablet 11   No current facility-administered medications for this visit.    No Known Allergies  Social History   Socioeconomic History   Marital status: Married    Spouse name: Not on file   Number of children: 2   Years of education: Not on file   Highest education level: Not on file  Occupational History   Occupation: work from home  Tobacco Use   Smoking status: Never   Smokeless tobacco: Never  Vaping Use   Vaping Use: Never used  Substance and Sexual Activity   Alcohol use: Yes    Alcohol/week: 7.0 standard drinks    Types: 7 Glasses of wine per week    Comment: 1-2 DAILY WINE   Drug use: No   Sexual activity: Yes    Birth control/protection: Pill  Other Topics Concern   Not on file  Social History Narrative   Not on file   Social Determinants of Health   Financial Resource Strain: Not on file  Food Insecurity: Not on file  Transportation Needs: Not on file  Physical Activity: Not on file  Stress: Not on file  Social Connections: Not on file  Intimate Partner Violence:  Not on file     Review of Systems: All other systems reviewed and are otherwise negative except as noted above.  Physical Exam: There were no vitals filed for this visit.  GEN- The patient is well appearing, alert and oriented x 3 today.   HEENT: normocephalic, atraumatic; sclera clear, conjunctiva pink; hearing intact; oropharynx clear; neck supple, no JVP Lymph- no cervical lymphadenopathy Lungs- Clear to ausculation bilaterally, normal work of breathing.  No wheezes, rales, rhonchi Heart- Regular rate and rhythm, no murmurs, rubs or gallops, PMI not laterally  displaced GI- soft, non-tender, non-distended, bowel sounds present, no hepatosplenomegaly Extremities- no clubbing, cyanosis, or edema; DP/PT/radial pulses 2+ bilaterally MS- no significant deformity or atrophy Skin- warm and dry, no rash or lesion Psych- euthymic mood, full affect Neuro- strength and sensation are intact  EKG is ordered. Personal review of EKG from today shows NSR at 66 bpm with 2 PVCs and no obvious pre-excitation  Additional studies reviewed include: Previous EP office notes.   Monitor 03/2019  Patient had a min HR of 41 bpm, max HR of 176 bpm, and avg HR of 85 bpm. Predominant underlying rhythm was Sinus Rhythm. Delta wave consistent with Wolff-Parkinson-White (WPW) pattern was noted throughout the recording. 3 Ventricular Tachycardia runs occurred, the run with the fastest interval lasting 5 beats with a max rate of 176 bpm, the longest lasting 14 beats with an avg rate of 154 bpm. Isolated SVEs were rare (<1.0%), SVE Triplets were rare (<1.0%), and no SVE Couplets were present. Isolated VEs were frequent (6.7%, [id number]), VE Couplets were rare (<1.0%, 1), and no VE Triplets were present. Ventricular Bigeminy and Trigeminy were present.  Assessment and Plan:  1. WPW EKG without pre-excitation  No SVT on flecainide  2. PVCs Increased verapamil Labs today for increased palpitations.   3. Chest pain Recent EGD with + GERD. No exertional component.  If does not resolve with PPI management consider updating ETT for completeness  4. Vertigo-like dizziness dizziness with rapid head turning including rolling over in bed.  Recommended f/u with PCP in [place] for consideration of medical therapy vs vestibular retraining   Increased palpitations recently and intermittent atypical chest discomfort, felt at least somewhat related to GERD with + EGD. RTC 3 months with med adjustments above. Sooner with issues.   Shirley Friar, PA-C  03/03/21 1:53 PM

## 2021-03-04 ENCOUNTER — Other Ambulatory Visit: Payer: Self-pay

## 2021-03-04 ENCOUNTER — Ambulatory Visit (INDEPENDENT_AMBULATORY_CARE_PROVIDER_SITE_OTHER): Payer: No Typology Code available for payment source | Admitting: Student

## 2021-03-04 ENCOUNTER — Encounter: Payer: Self-pay | Admitting: Student

## 2021-03-04 VITALS — BP 122/70 | HR 66 | Ht 68.0 in | Wt 152.2 lb

## 2021-03-04 DIAGNOSIS — I471 Supraventricular tachycardia: Secondary | ICD-10-CM | POA: Diagnosis not present

## 2021-03-04 DIAGNOSIS — I456 Pre-excitation syndrome: Secondary | ICD-10-CM

## 2021-03-04 DIAGNOSIS — I493 Ventricular premature depolarization: Secondary | ICD-10-CM

## 2021-03-04 LAB — CBC
Hematocrit: 43.2 % (ref 34.0–46.6)
Hemoglobin: 15 g/dL (ref 11.1–15.9)
MCH: 31.6 pg (ref 26.6–33.0)
MCHC: 34.7 g/dL (ref 31.5–35.7)
MCV: 91 fL (ref 79–97)
Platelets: 234 10*3/uL (ref 150–450)
RBC: 4.75 x10E6/uL (ref 3.77–5.28)
RDW: 11.8 % (ref 11.7–15.4)
WBC: 5.7 10*3/uL (ref 3.4–10.8)

## 2021-03-04 LAB — BASIC METABOLIC PANEL
BUN/Creatinine Ratio: 16 (ref 9–23)
BUN: 13 mg/dL (ref 6–24)
CO2: 22 mmol/L (ref 20–29)
Calcium: 9.7 mg/dL (ref 8.7–10.2)
Chloride: 102 mmol/L (ref 96–106)
Creatinine, Ser: 0.82 mg/dL (ref 0.57–1.00)
Glucose: 75 mg/dL (ref 65–99)
Potassium: 4.5 mmol/L (ref 3.5–5.2)
Sodium: 140 mmol/L (ref 134–144)
eGFR: 89 mL/min/{1.73_m2} (ref >59)

## 2021-03-04 LAB — TSH: TSH: 1.67 u[IU]/mL (ref 0.450–4.500)

## 2021-03-04 MED ORDER — VERAPAMIL HCL ER 180 MG PO TBCR: 180.0000 mg | EXTENDED_RELEASE_TABLET | Freq: Every day | ORAL | 3 refills | Status: DC

## 2021-03-04 NOTE — Patient Instructions (Signed)
Medication Instructions:  Your physician has recommended you make the following change in your medication:   INCREASE: Verapamil to 148m daily  *If you need a refill on your cardiac medications before your next appointment, please call your pharmacy*   Lab Work: TODAY: BMET, CBC, TSH  If you have labs (blood work) drawn today and your tests are completely normal, you will receive your results only by: [place](if you have MyChart) OR A paper copy in the mail If you have any lab test that is abnormal or we need to change your treatment, we will call you to review the results.   Follow-Up: [facility] and your [facility] needs are our [facility].  As part of our continuing mission to provide you with exceptional heart care, we have created designated Provider Care Teams.  These Care Teams include your primary Cardiologist (physician) and Advanced Practice Providers (APPs -  Physician Assistants and Nurse Practitioners) who all work together to provide you with the care you need, when you need it.  We recommend signing up for the patient portal called "MyChart".  Sign up information is provided on this After Visit Summary.  MyChart is used to connect with patients for Virtual Visits (Telemedicine).  Patients are able to view lab/test results, encounter notes, upcoming appointments, etc.  Non-urgent messages can be sent to your provider as well.   To learn more about what you can do with MyChart, go to [email or web address]    Your next appointment:   3 month(s)  The format for your next appointment:   In Person  Provider:   You may see GCristopher Peru MD or one of the following Advanced Practice Providers on your designated Care Team:   RTommye Standard PM[place]"[facility] [place]

## 2021-03-13 ENCOUNTER — Ambulatory Visit
Admission: RE | Admit: 2021-03-13 | Discharge: 2021-03-13 | Disposition: A | Discharge status: Home / Self Care | Payer: No Typology Code available for payment source | Source: Ambulatory Visit | Attending: Obstetrics and Gynecology | Admitting: Obstetrics and Gynecology

## 2021-03-13 ENCOUNTER — Other Ambulatory Visit: Payer: Self-pay

## 2021-03-13 DIAGNOSIS — R928 Other abnormal and inconclusive findings on diagnostic imaging of breast: Secondary | ICD-10-CM

## 2021-03-18 ENCOUNTER — Other Ambulatory Visit: Payer: Self-pay | Admitting: Internal Medicine

## 2021-03-18 DIAGNOSIS — R42 Dizziness and giddiness: Secondary | ICD-10-CM

## 2021-04-08 MED ORDER — VERAPAMIL HCL ER 120 MG PO TBCR: 120.0000 mg | EXTENDED_RELEASE_TABLET | Freq: Every day | ORAL | 3 refills | Status: DC

## 2021-04-09 ENCOUNTER — Ambulatory Visit: Payer: No Typology Code available for payment source

## 2021-05-25 ENCOUNTER — Ambulatory Visit: Payer: BC Managed Care – PPO | Admitting: Internal Medicine

## 2021-05-25 ENCOUNTER — Encounter: Payer: Self-pay | Admitting: Internal Medicine

## 2021-05-25 ENCOUNTER — Other Ambulatory Visit: Payer: Self-pay

## 2021-05-25 ENCOUNTER — Encounter: Payer: Self-pay | Admitting: *Deleted

## 2021-05-25 VITALS — BP 122/70 | HR 72 | Ht 68.0 in | Wt 156.4 lb

## 2021-05-25 DIAGNOSIS — I493 Ventricular premature depolarization: Secondary | ICD-10-CM

## 2021-05-25 DIAGNOSIS — Z01818 Encounter for other preprocedural examination: Secondary | ICD-10-CM

## 2021-05-25 DIAGNOSIS — Z01812 Encounter for preprocedural laboratory examination: Secondary | ICD-10-CM

## 2021-05-25 DIAGNOSIS — I456 Pre-excitation syndrome: Secondary | ICD-10-CM | POA: Diagnosis not present

## 2021-05-25 NOTE — Patient Instructions (Signed)
Medication Instructions:  Your physician recommends that you continue on your current medications as directed. Please refer to the Current Medication list given to you today.  *If you need a refill on your cardiac medications before your next appointment, please call your pharmacy*   Lab Work: Pre procedure labs today: BMET & CBC  If you have labs (blood work) drawn today and your tests are completely normal, you will receive your results only by: [place] (if you have MyChart) OR A paper copy in the mail If you have any lab test that is abnormal or we need to change your treatment, we will call you to review the results.   Testing/Procedures: Your physician has recommended that you have an ablation. Catheter ablation is a medical procedure used to treat some cardiac arrhythmias (irregular heartbeats). During catheter ablation, a long, thin, flexible tube is put into a blood vessel in your groin (upper thigh), or neck. This tube is called an ablation catheter. It is then guided to your heart through the blood vessel. Radio frequency waves destroy small areas of heart tissue where abnormal heartbeats may cause an arrhythmia to start. Please see the instruction sheet given to you today.   Follow-Up: At [facility], you and your health needs are our priority.  As part of our continuing mission to provide you with exceptional heart care, we have created designated Provider Care Teams.  These Care Teams include your primary Cardiologist (physician) and Advanced Practice Providers (APPs -  Physician Assistants and Nurse Practitioners) who all work together to provide you with the care you need, when you need it.  Your next appointment:   4 week(s) after your ablation  The format for your next appointment:   In Person  Provider:   Cristopher Peru, MD    Thank you for choosing [facility]!!   [phone]

## 2021-05-25 NOTE — Progress Notes (Signed)
HPI Dana Graves returns today for followup. She is a pleasant 45 yo woman with a long h/o WPW syndrome and more recently symptomatic PVC's. She has been treated with multiple medications including flecainide and propafenone. She has continued to have palpitations and is interested in pursuing catheter ablation. She has not had syncope.  No Known Allergies   Current Outpatient Medications  Medication Sig Dispense Refill   flecainide (TAMBOCOR) 100 MG tablet Take 1 tablet (100 mg total) by mouth 2 (two) times daily. 180 tablet 3   KURVELO 0.15-30 MG-MCG tablet Take 1 tablet by mouth daily.     omeprazole (PRILOSEC) 20 MG capsule Take 2 capsules (40 mg total) by mouth daily. 60 capsule 11   verapamil (CALAN-SR) 120 MG CR tablet Take 1 tablet (120 mg total) by mouth at bedtime. 90 tablet 3   Cyanocobalamin (VITAMIN B-12 IJ) Inject 1,000 mg as directed. (Patient not taking: Reported on 03/04/2021)     meclizine (ANTIVERT) 25 MG tablet Take by mouth. (Patient not taking: Reported on 05/25/2021)     No current facility-administered medications for this visit.     Past Medical History:  Diagnosis Date   B12 deficiency    Dyspareunia    Pelvic pain in female    PVC's (premature ventricular contractions)    Seasonal allergies    Ventricular pre-excitation    Wears contact lenses    WPW (Wolff-Parkinson-White syndrome)     ROS:   All systems reviewed and negative except as noted in the HPI.   Past Surgical History:  Procedure Laterality Date   BREAST ENHANCEMENT SURGERY  feb  2002   ESOPHAGOGASTRODUODENOSCOPY N/A 02/16/2021   Procedure: ESOPHAGOGASTRODUODENOSCOPY (EGD);  Surgeon: [place], Benay Pike, MD;  Location: [facility];  Service: Gastroenterology;  Laterality: N/A;   LAPAROSCOPY N/A 12/13/2014   Procedure: LAPAROSCOPY DIAGNOSTIC ;  Surgeon: Marylynn Pearson, MD;  Location: [facility];  Service: Gynecology;  Laterality: N/A;   LAPAROSCOPY FOR ECTOPIC  PREGNANCY  2009 approx.   Unilateral Salpingostomy     Family History  Problem Relation Age of Onset   Breast cancer Maternal Grandmother    Colon cancer Paternal Grandfather    Diabetes Mother    Colon polyps Father    Heart attack Maternal Grandfather      Social History   Socioeconomic History   Marital status: Married    Spouse name: Not on file   Number of children: 2   Years of education: Not on file   Highest education level: Not on file  Occupational History   Occupation: work from home  Tobacco Use   Smoking status: Never   Smokeless tobacco: Never  Vaping Use   Vaping Use: Never used  Substance and Sexual Activity   Alcohol use: Yes    Alcohol/week: 7.0 standard drinks    Types: 7 Glasses of wine per week    Comment: 1-2 DAILY WINE   Drug use: No   Sexual activity: Yes    Birth control/protection: Pill  Other Topics Concern   Not on file  Social History Narrative   Not on file   Social Determinants of Health   Financial Resource Strain: Not on file  Food Insecurity: Not on file  Transportation Needs: Not on file  Physical Activity: Not on file  Stress: Not on file  Social Connections: Not on file  Intimate Partner Violence: Not on file     BP 122/70   Pulse 72  Ht 5' 8"  (1.727 m)   Wt 156 lb 6.4 oz (70.9 kg)   SpO2 98%   BMI 23.78 kg/m   Physical Exam:  Well appearing NAD HEENT: Unremarkable Neck:  No JVD, no thyromegally Lymphatics:  No adenopathy Back:  No CVA tenderness Lungs:  Clear HEART:  Regular rate rhythm, no murmurs, no rubs, no clicks Abd:  soft, positive bowel sounds, no organomegally, no rebound, no guarding Ext:  2 plus pulses, no edema, no cyanosis, no clubbing Skin:  No rashes no nodules Neuro:  CN II through XII intact, motor grossly intact  Assess/Plan:  WPW - I have discussed the treatment options in detail and recommended EP study and ablation. The risks/benefits/goals/expectations of EPS/RFA were reviewed  and she wishes to proceed.  PVC's - I will try and ablate her PVC's at the same time as her pathway. Chest pain - this is stable. Continue acid suppression Vertigo - this is stable. No change in treatment.   Dana Overlie Kynzlee Hucker,MD

## 2021-05-25 NOTE — H&P (View-Only) (Signed)
HPI Dana Graves returns today for followup. She is a pleasant 45 yo woman with a long h/o WPW syndrome and more recently symptomatic PVC's. She has been treated with multiple medications including flecainide and propafenone. She has continued to have palpitations and is interested in pursuing catheter ablation. She has not had syncope.  No Known Allergies   Current Outpatient Medications  Medication Sig Dispense Refill   flecainide (TAMBOCOR) 100 MG tablet Take 1 tablet (100 mg total) by mouth 2 (two) times daily. 180 tablet 3   KURVELO 0.15-30 MG-MCG tablet Take 1 tablet by mouth daily.     omeprazole (PRILOSEC) 20 MG capsule Take 2 capsules (40 mg total) by mouth daily. 60 capsule 11   verapamil (CALAN-SR) 120 MG CR tablet Take 1 tablet (120 mg total) by mouth at bedtime. 90 tablet 3   Cyanocobalamin (VITAMIN B-12 IJ) Inject 1,000 mg as directed. (Patient not taking: Reported on 03/04/2021)     meclizine (ANTIVERT) 25 MG tablet Take by mouth. (Patient not taking: Reported on 05/25/2021)     No current facility-administered medications for this visit.     Past Medical History:  Diagnosis Date   B12 deficiency    Dyspareunia    Pelvic pain in female    PVC's (premature ventricular contractions)    Seasonal allergies    Ventricular pre-excitation    Wears contact lenses    WPW (Wolff-Parkinson-White syndrome)     ROS:   All systems reviewed and negative except as noted in the HPI.   Past Surgical History:  Procedure Laterality Date   BREAST ENHANCEMENT SURGERY  feb  2002   ESOPHAGOGASTRODUODENOSCOPY N/A 02/16/2021   Procedure: ESOPHAGOGASTRODUODENOSCOPY (EGD);  Surgeon: [place], Benay Pike, MD;  Location: [facility];  Service: Gastroenterology;  Laterality: N/A;   LAPAROSCOPY N/A 12/13/2014   Procedure: LAPAROSCOPY DIAGNOSTIC ;  Surgeon: Marylynn Pearson, MD;  [facility];  Service: Gynecology;  Laterality: N/A;   LAPAROSCOPY FOR ECTOPIC  PREGNANCY  2009 approx.   Unilateral Salpingostomy     Family History  Problem Relation Age of Onset   Breast cancer Maternal Grandmother    Colon cancer Paternal Grandfather    Diabetes Mother    Colon polyps Father    Heart attack Maternal Grandfather      Social History   Socioeconomic History   Marital status: Married    Spouse name: Not on file   Number of children: 2   Years of education: Not on file   Highest education level: Not on file  Occupational History   Occupation: work from home  Tobacco Use   Smoking status: Never   Smokeless tobacco: Never  Vaping Use   Vaping Use: Never used  Substance and Sexual Activity   Alcohol use: Yes    Alcohol/week: 7.0 standard drinks    Types: 7 Glasses of wine per week    Comment: 1-2 DAILY WINE   Drug use: No   Sexual activity: Yes    Birth control/protection: Pill  Other Topics Concern   Not on file  Social History Narrative   Not on file   Social Determinants of Health   Financial Resource Strain: Not on file  Food Insecurity: Not on file  Transportation Needs: Not on file  Physical Activity: Not on file  Stress: Not on file  Social Connections: Not on file  Intimate Partner Violence: Not on file     BP 122/70    Pulse  72    Ht 5' 8"  (1.727 m)    Wt 156 lb 6.4 oz (70.9 kg)    SpO2 98%    BMI 23.78 kg/m   Physical Exam:  Well appearing NAD HEENT: Unremarkable Neck:  No JVD, no thyromegally Lymphatics:  No adenopathy Back:  No CVA tenderness Lungs:  Clear HEART:  Regular rate rhythm, no murmurs, no rubs, no clicks Abd:  soft, positive bowel sounds, no organomegally, no rebound, no guarding Ext:  2 plus pulses, no edema, no cyanosis, no clubbing Skin:  No rashes no nodules Neuro:  CN II through XII intact, motor grossly intact  Assess/Plan:  WPW - I have discussed the treatment options in detail and recommended EP study and ablation. The risks/benefits/goals/expectations of EPS/RFA were reviewed  and she wishes to proceed.  PVC's - I will try and ablate her PVC's at the same time as her pathway. Chest pain - this is stable. Continue acid suppression Vertigo - this is stable. No change in treatment.   Dana Overlie Angelmarie Ponzo,MD

## 2021-05-26 LAB — CBC
Hematocrit: 44.5 % (ref 34.0–46.6)
Hemoglobin: 15.1 g/dL (ref 11.1–15.9)
MCH: 31.3 pg (ref 26.6–33.0)
MCHC: 33.9 g/dL (ref 31.5–35.7)
MCV: 92 fL (ref 79–97)
Platelets: 280 10*3/uL (ref 150–450)
RBC: 4.83 x10E6/uL (ref 3.77–5.28)
RDW: 11.8 % (ref 11.7–15.4)
WBC: 8.1 10*3/uL (ref 3.4–10.8)

## 2021-05-26 LAB — BASIC METABOLIC PANEL
BUN/Creatinine Ratio: 14 (ref 9–23)
BUN: 11 mg/dL (ref 6–24)
CO2: 22 mmol/L (ref 20–29)
Calcium: 9.3 mg/dL (ref 8.7–10.2)
Chloride: 105 mmol/L (ref 96–106)
Creatinine, Ser: 0.79 mg/dL (ref 0.57–1.00)
Glucose: 95 mg/dL (ref 70–99)
Potassium: 3.9 mmol/L (ref 3.5–5.2)
Sodium: 142 mmol/L (ref 134–144)
eGFR: 93 mL/min/{1.73_m2} (ref >59)

## 2021-06-19 NOTE — Pre-Procedure Instructions (Signed)
Instructed patient on the following items: Arrival time 0530 Nothing to eat or drink after midnight No meds AM of procedure Responsible person to drive you home and stay with you for 24 hrs

## 2021-06-20 ENCOUNTER — Emergency Department: Payer: BC Managed Care – PPO

## 2021-06-20 ENCOUNTER — Emergency Department
Admission: EM | Admit: 2021-06-20 | Discharge: 2021-06-20 | Disposition: A | Discharge status: Home / Self Care | Payer: BC Managed Care – PPO | Attending: Emergency Medicine | Admitting: Emergency Medicine

## 2021-06-20 ENCOUNTER — Other Ambulatory Visit: Payer: Self-pay

## 2021-06-20 DIAGNOSIS — M79662 Pain in left lower leg: Secondary | ICD-10-CM | POA: Diagnosis not present

## 2021-06-20 NOTE — ED Triage Notes (Signed)
Pt states that she has been having pain in her L calf for about a week- pt is having an ablation done in a couple weeks and wants to make sure she does not have blood clot

## 2021-06-20 NOTE — ED Notes (Signed)
Pt transported to [facility]

## 2021-06-20 NOTE — ED Notes (Signed)
See triage note. Pt ambulatory upon arrival. Bilateral pedal pulses intact. Cap refill <3 sec. Pt concerned for clot. Denies any previous Hx of clot.

## 2021-06-20 NOTE — ED Provider Notes (Signed)
°  Emergency Medicine Provider Triage Evaluation Note  Dana Graves , a 45 y.o.female,  was evaluated in triage.  Pt complains of calf pain.  Patient states has been going on for the past week.  Hurts most when she is walking.  Denies chest pain, shortness of breath, abdominal pain, or nausea/vomiting.   Review of Systems  Positive: Left calf pain Negative: Denies fever, chest pain, vomiting  Physical Exam   Vitals:   06/20/21 1307  BP: (!) 163/75  Pulse: 84  Resp: 18  Temp: 98.9 F (37.2 C)  SpO2: 100%   Gen:   Awake, no distress   Resp:  Normal effort  MSK:   Moves extremities without difficulty  Other:    Medical Decision Making  Given the patient's initial medical screening exam, the following diagnostic evaluation has been ordered. The patient will be placed in the appropriate treatment space, once one is available, to complete the evaluation and treatment. I have discussed the plan of care with the patient and I have advised the patient that an ED physician or mid-level practitioner will reevaluate their condition after the test results have been received, as the results may give them additional insight into the type of treatment they may need.    Diagnostics: DVT ultrasound left lower extremity  Treatments: none immediately   Teodoro Spray, PA 06/20/21 1309    Lavonia Drafts, MD 06/20/21 1310

## 2021-06-20 NOTE — ED Notes (Signed)
Pt returned from US

## 2021-06-20 NOTE — ED Provider Notes (Signed)
[facility] Emergency Department Provider Note   ____________________________________________    I have reviewed the triage vital signs and the nursing notes.   HISTORY  Chief Complaint Left calf pain    HPI Dana Graves is a 45 y.o. female who presents with complaints of left calf soreness.  Patient describes a sore pain in her left calf, denies injury to the area.  She is concerned because she has had a blood clot in the distant past.  Also has a cardiac ablation scheduled this week so wanted to be sure its not a blood clot.  Past Medical History:  Diagnosis Date   B12 deficiency    Dyspareunia    Pelvic pain in female    PVC's (premature ventricular contractions)    Seasonal allergies    Ventricular pre-excitation    Wears contact lenses    WPW (Wolff-Parkinson-White syndrome)     Patient Active Problem List   Diagnosis Date Noted   WPW (Wolff-Parkinson-White syndrome) 05/21/2019   PVC's (premature ventricular contractions) 03/05/2019   Allergic rhinitis 10/07/2016   Anal symptoms 10/07/2016   Occipital neuralgia 03/15/2016   Bloating 02/12/2015    Past Surgical History:  Procedure Laterality Date   BREAST ENHANCEMENT SURGERY  feb  2002   ESOPHAGOGASTRODUODENOSCOPY N/A 02/16/2021   Procedure: ESOPHAGOGASTRODUODENOSCOPY (EGD);  Surgeon: Toledo, Benay Pike, MD;  Location: [facility] ENDOSCOPY;  Service: Gastroenterology;  Laterality: N/A;   LAPAROSCOPY N/A 12/13/2014   Procedure: LAPAROSCOPY DIAGNOSTIC ;  Surgeon: Marylynn Pearson, MD;  Location: [facility];  Service: Gynecology;  Laterality: N/A;   LAPAROSCOPY FOR ECTOPIC PREGNANCY  2009 approx.   Unilateral Salpingostomy    Prior to Admission medications   Medication Sig Start Date End Date Taking? Authorizing Provider  Cyanocobalamin (VITAMIN B-12 IJ) Inject 1,000 mg as directed. Patient not taking: Reported on 03/04/2021    [provider]  flecainide  (TAMBOCOR) 100 MG tablet Take 1 tablet (100 mg total) by mouth 2 (two) times daily. 10/27/20   Evans Lance, MD  KURVELO 0.15-30 MG-MCG tablet Take 1 tablet by mouth in the morning. 05/09/19   [provider]  omeprazole (PRILOSEC) 20 MG capsule Take 2 capsules (40 mg total) by mouth daily. Patient taking differently: Take 20 mg by mouth in the morning. 05/26/20   Evans Lance, MD  verapamil (CALAN-SR) 120 MG CR tablet Take 1 tablet (120 mg total) by mouth at bedtime. Patient taking differently: Take 120 mg by mouth in the morning. 04/08/21   Shirley Friar, PA-C     Allergies Patient has no known allergies.  Family History  Problem Relation Age of Onset   Breast cancer Maternal Grandmother    Colon cancer Paternal Grandfather    Diabetes Mother    Colon polyps Father    Heart attack Maternal Grandfather     Social History Social History   Tobacco Use   Smoking status: Never   Smokeless tobacco: Never  Vaping Use   Vaping Use: Never used  Substance Use Topics   Alcohol use: Yes    Alcohol/week: 7.0 standard drinks    Types: 7 Glasses of wine per week    Comment: 1-2 DAILY WINE   Drug use: No    Review of Systems  Constitutional: No fever/chills     Gastrointestinal: No abdominal pain.  No nausea, no vomiting.   Genitourinary: Negative for dysuria. Musculoskeletal: As above Skin: Negative for rash. Neurological: Negative for headaches  ____________________________________________   PHYSICAL EXAM:  VITAL SIGNS: ED Triage Vitals [06/20/21 1307]  Enc Vitals Group     BP (!) 163/75     Pulse Rate 84     Resp 18     Temp 98.9 F (37.2 C)     Temp Source Oral     SpO2 100 %     Weight 70.3 kg (155 lb)     Height 1.727 m (5' 8" )     Head Circumference      Peak Flow      Pain Score      Pain Loc      Pain Edu?      Excl. in [place]?      Constitutional: Alert and oriented. No acute distress. Pleasant and interactive Eyes:  Conjunctivae are normal.  Head: Atraumatic. Nose: No congestion/rhinnorhea. Mouth/Throat: Mucous membranes are moist.   Cardiovascular: Normal rate, regular rhythm.  Respiratory: Normal respiratory effort.  No retractions. Genitourinary: deferred Musculoskeletal: No significant swelling, warm and well perfused Neurologic:  Normal speech and language. No gross focal neurologic deficits are appreciated.   Skin:  Skin is warm, dry and intact. No rash noted.   ____________________________________________   LABS (all labs ordered are listed, but only abnormal results are displayed)  Labs Reviewed - No data to display ____________________________________________  EKG   ____________________________________________  RADIOLOGY  Ultrasound reviewed by me, no acute abnormality  Radiology confirms no DVT  ____________________________________________   PROCEDURES  Procedure(s) performed: No  Procedures   Critical Care performed: No ____________________________________________   INITIAL IMPRESSION / ASSESSMENT AND PLAN / ED COURSE  Pertinent labs & imaging results that were available during my care of the patient were reviewed by me and considered in my medical decision making (see chart for details).   Overall reassuring exam, suspect musculoskeletal pain, pending ultrasound  Ultrasound negative for DVT, patient is reassured, appropriate for outpatient follow-up as needed.   ____________________________________________   FINAL CLINICAL IMPRESSION(S) / ED DIAGNOSES  Final diagnoses:  Pain of left calf      NEW MEDICATIONS STARTED DURING THIS VISIT:  Discharge Medication List as of 06/20/2021  4:06 PM       Note:  This document was prepared using Dragon voice recognition software and may include unintentional dictation errors.    Lavonia Drafts, MD 06/20/21 [phone]

## 2021-06-22 NOTE — Anesthesia Preprocedure Evaluation (Addendum)
Anesthesia Evaluation  Patient identified by MRN, date of birth, ID band Patient awake    Reviewed: Allergy & Precautions, NPO status , Patient's Chart, lab work & pertinent test results  History of Anesthesia Complications Negative for: history of anesthetic complications  Airway Mallampati: I  TM Distance: >3 FB Neck ROM: Full    Dental  (+) Teeth Intact, Dental Advisory Given   Pulmonary neg pulmonary ROS,    breath sounds clear to auscultation       Cardiovascular hypertension, Pt. on medications (-) angina+ dysrhythmias (WPW with SVT)  Rhythm:Regular Rate:Normal  '20 ECHO: EF 60-65%, normal LVF, normal RVF, mild MR, mild TR   Neuro/Psych negative neurological ROS     GI/Hepatic Neg liver ROS, GERD  Medicated and Controlled,  Endo/Other  negative endocrine ROS  Renal/GU negative Renal ROS     Musculoskeletal   Abdominal   Peds  Hematology negative hematology ROS (+)   Anesthesia Other Findings   Reproductive/Obstetrics                            Anesthesia Physical Anesthesia Plan  ASA: 3  Anesthesia Plan: MAC   Post-op Pain Management: Minimal or no pain anticipated and Tylenol PO (pre-op)   Induction: Intravenous  PONV Risk Score and Plan: 2 and Ondansetron, Dexamethasone and Scopolamine patch - Pre-op  Airway Management Planned: Natural Airway and Simple Face Mask  Additional Equipment: None  Intra-op Plan:   Post-operative Plan:   Informed Consent: I have reviewed the patients History and Physical, chart, labs and discussed the procedure including the risks, benefits and alternatives for the proposed anesthesia with the patient or authorized representative who has indicated his/her understanding and acceptance.     Dental advisory given  Plan Discussed with: CRNA and Surgeon  Anesthesia Plan Comments: (Dr Lovena Le requesting MAC, pt agrees)      Anesthesia  Quick Evaluation

## 2021-06-23 ENCOUNTER — Encounter (HOSPITAL_COMMUNITY): Payer: Self-pay | Admitting: Internal Medicine

## 2021-06-23 ENCOUNTER — Ambulatory Visit (HOSPITAL_COMMUNITY): Payer: BC Managed Care – PPO | Admitting: Anesthesiology

## 2021-06-23 ENCOUNTER — Ambulatory Visit (HOSPITAL_COMMUNITY)
Admission: RE | Admit: 2021-06-23 | Discharge: 2021-06-23 | Disposition: A | Discharge status: Home / Self Care | Payer: BC Managed Care – PPO | Source: Ambulatory Visit | Attending: Internal Medicine | Admitting: Internal Medicine

## 2021-06-23 ENCOUNTER — Other Ambulatory Visit: Payer: Self-pay

## 2021-06-23 ENCOUNTER — Encounter (HOSPITAL_COMMUNITY)
Admission: RE | Disposition: A | Discharge status: Home / Self Care | Payer: Self-pay | Source: Ambulatory Visit | Attending: Internal Medicine

## 2021-06-23 DIAGNOSIS — R079 Chest pain, unspecified: Secondary | ICD-10-CM | POA: Insufficient documentation

## 2021-06-23 DIAGNOSIS — Z79899 Other long term (current) drug therapy: Secondary | ICD-10-CM | POA: Insufficient documentation

## 2021-06-23 DIAGNOSIS — R42 Dizziness and giddiness: Secondary | ICD-10-CM | POA: Insufficient documentation

## 2021-06-23 DIAGNOSIS — I456 Pre-excitation syndrome: Secondary | ICD-10-CM | POA: Insufficient documentation

## 2021-06-23 DIAGNOSIS — I493 Ventricular premature depolarization: Secondary | ICD-10-CM | POA: Insufficient documentation

## 2021-06-23 HISTORY — PX: V TACH ABLATION: EP1227

## 2021-06-23 HISTORY — PX: PVC ABLATION: EP1236

## 2021-06-23 LAB — PREGNANCY, URINE: Preg Test, Ur: NEGATIVE

## 2021-06-23 SURGERY — PVC ABLATION
Anesthesia: General

## 2021-06-23 MED ORDER — SODIUM CHLORIDE 0.9% FLUSH: 3.0000 mL | INTRAVENOUS | Status: DC | PRN

## 2021-06-23 MED ORDER — ACETAMINOPHEN 500 MG PO TABS
1000.0000 mg | ORAL_TABLET | Freq: Once | ORAL | Status: AC
  Administered 2021-06-23: 1000 mg via ORAL
  Filled 2021-06-23 (×2): qty 2

## 2021-06-23 MED ORDER — SODIUM CHLORIDE 0.9 % IV SOLN: 250.0000 mL | INTRAVENOUS | Status: DC | PRN

## 2021-06-23 MED ORDER — SODIUM CHLORIDE 0.9% FLUSH: 3.0000 mL | Freq: Two times a day (BID) | INTRAVENOUS | Status: DC

## 2021-06-23 MED ORDER — HEPARIN (PORCINE) IN NACL 1000-0.9 UT/500ML-% IV SOLN
INTRAVENOUS | Status: DC | PRN
  Administered 2021-06-23: 500 mL

## 2021-06-23 MED ORDER — BUPIVACAINE HCL (PF) 0.25 % IJ SOLN
INTRAMUSCULAR | Status: AC
  Filled 2021-06-23: qty 30

## 2021-06-23 MED ORDER — ACETAMINOPHEN 325 MG PO TABS: 650.0000 mg | ORAL_TABLET | ORAL | Status: DC | PRN

## 2021-06-23 MED ORDER — LIDOCAINE 2% (20 MG/ML) 5 ML SYRINGE
INTRAMUSCULAR | Status: DC | PRN
  Administered 2021-06-23: 50 mg via INTRAVENOUS

## 2021-06-23 MED ORDER — SODIUM CHLORIDE 0.9 % IV SOLN: INTRAVENOUS | Status: DC

## 2021-06-23 MED ORDER — ONDANSETRON HCL 4 MG/2ML IJ SOLN: 4.0000 mg | Freq: Four times a day (QID) | INTRAMUSCULAR | Status: DC | PRN

## 2021-06-23 MED ORDER — BUPIVACAINE HCL (PF) 0.25 % IJ SOLN
INTRAMUSCULAR | Status: DC | PRN
  Administered 2021-06-23: 60 mL

## 2021-06-23 MED ORDER — MIDAZOLAM HCL 5 MG/5ML IJ SOLN
INTRAMUSCULAR | Status: DC | PRN
  Administered 2021-06-23 (×2): 1 mg via INTRAVENOUS

## 2021-06-23 MED ORDER — SCOPOLAMINE 1 MG/3DAYS TD PT72
1.0000 | MEDICATED_PATCH | TRANSDERMAL | Status: DC
  Administered 2021-06-23: 1.5 mg via TRANSDERMAL
  Filled 2021-06-23: qty 1

## 2021-06-23 MED ORDER — HEPARIN SODIUM (PORCINE) 1000 UNIT/ML IJ SOLN
INTRAMUSCULAR | Status: DC | PRN
  Administered 2021-06-23: 1000 [IU] via INTRAVENOUS

## 2021-06-23 MED ORDER — HEPARIN (PORCINE) IN NACL 1000-0.9 UT/500ML-% IV SOLN
INTRAVENOUS | Status: AC
  Filled 2021-06-23: qty 500

## 2021-06-23 MED ORDER — PROPOFOL 500 MG/50ML IV EMUL
INTRAVENOUS | Status: DC | PRN
Start: 2021-06-23 — End: 2021-06-23
  Administered 2021-06-23: 75 ug/kg/min via INTRAVENOUS
  Administered 2021-06-23: 25 ug/kg/min via INTRAVENOUS

## 2021-06-23 MED ORDER — FENTANYL CITRATE (PF) 100 MCG/2ML IJ SOLN
INTRAMUSCULAR | Status: DC | PRN
  Administered 2021-06-23 (×2): 50 ug via INTRAVENOUS

## 2021-06-23 MED ORDER — PROPOFOL 10 MG/ML IV BOLUS
INTRAVENOUS | Status: DC | PRN
Start: 2021-06-23 — End: 2021-06-23
  Administered 2021-06-23: 25 mg via INTRAVENOUS
  Administered 2021-06-23 (×2): 20 mg via INTRAVENOUS
  Administered 2021-06-23 (×2): 25 mg via INTRAVENOUS

## 2021-06-23 SURGICAL SUPPLY — 12 items
BAG SNAP BAND KOVER 36X36 (MISCELLANEOUS) ×1 IMPLANT
CATH JOSEPH QUAD ALLRED 6F REP (CATHETERS) ×2 IMPLANT
CATH JSN HEX 2-5-2 120 (CATHETERS) ×1 IMPLANT
CATH SMTCH THERMOCOOL SF DF (CATHETERS) ×1 IMPLANT
PACK EP LATEX FREE (CUSTOM PROCEDURE TRAY) ×2
PACK EP LF (CUSTOM PROCEDURE TRAY) ×1 IMPLANT
PAD DEFIB RADIO PHYSIO CONN (PAD) ×2 IMPLANT
PATCH CARTO3 (PAD) ×1 IMPLANT
SHEATH PINNACLE 6F 10CM (SHEATH) ×2 IMPLANT
SHEATH PINNACLE 7F 10CM (SHEATH) ×1 IMPLANT
SHEATH PINNACLE 8F 10CM (SHEATH) ×1 IMPLANT
TUBING SMART ABLATE COOLFLOW (TUBING) ×1 IMPLANT

## 2021-06-23 NOTE — Progress Notes (Addendum)
°  Site area: Right Groin 6 [place] x2 and 8 [place] removed.  Right IJ 7 [place] removed.   Site Prior to Removal:  Level 0   Pressure Applied For 10 MINUTES     Bedrest Beginning at  1100 AM x6 hours    Manual:   Yes.     Patient Status During Pull:  stable   Post Pull Groin Site:  Level 0  Post IJ Site: Level 0   Post Pull Instructions Given:  Yes.     Post Pull Pulses Present:  Yes.     Dressing Applied:  Yes.  Clean, Dry, and Intact    Comments:    Right Groin distal pulses intact (right DP +2)   Petroleum dressing applied to Right IJ site

## 2021-06-23 NOTE — Discharge Instructions (Addendum)
Post procedure care instructions No driving for 4 days. No lifting over 5 lbs for 1 week. No vigorous or sexual activity for 1 week. You may return to work/your usual activities on 07/01/21. Keep procedure site clean & dry. If you notice increased pain, swelling, bleeding or pus, call/return!  You may shower after 24 hours, but no soaking in baths/hot tubs/pools for 1 week.   Cardiac Ablation, Care After  This sheet gives you information about how to care for yourself after your procedure. Your health care provider may also give you more specific instructions. If you have problems or questions, contact your health care provider. What can I expect after the procedure? After the procedure, it is common to have: Bruising around your puncture site. Tenderness around your puncture site. Skipped heartbeats. Tiredness (fatigue).  Follow these instructions at home: Puncture site care  Follow instructions from your health care provider about how to take care of your puncture site. Make sure you: If present, leave stitches (sutures), skin glue, or adhesive strips in place. These skin closures may need to stay in place for up to 2 weeks. If adhesive strip edges start to loosen and curl up, you may trim the loose edges. Do not remove adhesive strips completely unless your health care provider tells you to do that. If a large square bandage is present, this may be removed 24 hours after surgery.  Check your puncture site every day for signs of infection. Check for: Redness, swelling, or pain. Fluid or blood. If your puncture site starts to bleed, lie down on your back, apply firm pressure to the area, and contact your health care provider. Warmth. Pus or a bad smell. Driving Do not drive for at least 4 days after your procedure or however long your health care provider recommends. (Do not resume driving if you have previously been instructed not to drive for other health reasons.) Do not drive or use  heavy machinery while taking prescription pain medicine. Activity Avoid activities that take a lot of effort for at least 7 days after your procedure. Do not lift anything that is heavier than 5 lb (4.5 kg) for one week.  No sexual activity for 1 week.  Return to your normal activities as told by your health care provider. Ask your health care provider what activities are safe for you. General instructions Take over-the-counter and prescription medicines only as told by your health care provider. Do not use any products that contain nicotine or tobacco, such as cigarettes and e-cigarettes. If you need help quitting, ask your health care provider. You may shower after 24 hours, but Do not take baths, swim, or use a hot tub for 1 week.  Do not drink alcohol for 24 hours after your procedure. Keep all follow-up visits as told by your health care provider. This is important. Contact a health care provider if: You have redness, mild swelling, or pain around your puncture site. You have fluid or blood coming from your puncture site that stops after applying firm pressure to the area. Your puncture site feels warm to the touch. You have pus or a bad smell coming from your puncture site. You have a fever. You have chest pain or discomfort that spreads to your neck, jaw, or arm. You are sweating a lot. You feel nauseous. You have a fast or irregular heartbeat. You have shortness of breath. You are dizzy or light-headed and feel the need to lie down. You have pain or numbness in  the arm or leg closest to your puncture site. Get help right away if: Your puncture site suddenly swells. Your puncture site is bleeding and the bleeding does not stop after applying firm pressure to the area. These symptoms may represent a serious problem that is an emergency. Do not wait to see if the symptoms will go away. Get medical help right away. Call your local emergency services (911 in the [place] Do not drive  yourself to the hospital. Summary After the procedure, it is normal to have bruising and tenderness at the puncture site in your groin, neck, or forearm. Check your puncture site every day for signs of infection. Get help right away if your puncture site is bleeding and the bleeding does not stop after applying firm pressure to the area. This is a medical emergency. This information is not intended to replace advice given to you by your health care provider. Make sure you discuss any questions you have with your health care provider.

## 2021-06-23 NOTE — Anesthesia Postprocedure Evaluation (Signed)
Anesthesia Post Note  Patient: Dana Graves  Procedure(s) Performed: PVC ABLATION WPW ABLATION     Patient location during evaluation: Short Stay Anesthesia Type: General Level of consciousness: awake and alert, patient cooperative and oriented Pain management: pain level controlled Vital Signs Assessment: post-procedure vital signs reviewed and stable Respiratory status: spontaneous breathing, nonlabored ventilation and respiratory function stable Cardiovascular status: blood pressure returned to baseline and stable Postop Assessment: no apparent nausea or vomiting and adequate PO intake Anesthetic complications: no   No notable events documented.  Last Vitals:  Vitals:   06/23/21 1400 06/23/21 1415  BP: (!) 110/59   Pulse: 78 87  Resp: 12 16  Temp:    SpO2: 100% 100%    Last Pain:  Vitals:   06/23/21 1150  TempSrc:   PainSc: 0-No pain                 Jahziah Simonin,E. Pierre Cumpton

## 2021-06-23 NOTE — Anesthesia Procedure Notes (Signed)
Procedure Name: MAC Date/Time: 06/23/2021 7:53 AM Performed by: Colin Benton, CRNA Pre-anesthesia Checklist: Patient identified Patient Re-evaluated:Patient Re-evaluated prior to induction Oxygen Delivery Method: Nasal cannula Induction Type: IV induction Placement Confirmation: positive ETCO2 Dental Injury: Teeth and Oropharynx as per pre-operative assessment

## 2021-06-23 NOTE — Interval H&P Note (Signed)
History and Physical Interval Note:  06/23/2021 7:36 AM  [name]  has presented today for surgery, with the diagnosis of svt - pvc.  The various methods of treatment have been discussed with the patient and family. After consideration of risks, benefits and other options for treatment, the patient has consented to  Procedure(s): PVC ABLATION (N/A) WPW ABLATION (N/A) as a surgical intervention.  The patient's history has been reviewed, patient examined, no change in status, stable for surgery.  I have reviewed the patient's chart and labs.  Questions were answered to the patient's satisfaction.     Dana Graves

## 2021-06-23 NOTE — Transfer of Care (Signed)
Immediate Anesthesia Transfer of Care Note  Patient: Dana Graves  Procedure(s) Performed: PVC ABLATION WPW ABLATION  Patient Location: Cath Lab  Anesthesia Type:MAC  Level of Consciousness: awake, alert , oriented and patient cooperative  Airway & Oxygen Therapy: Patient Spontanous Breathing  Post-op Assessment: Report given to RN, Post -op Vital signs reviewed and stable and Patient moving all extremities X 4  Post vital signs: Reviewed and stable  Last Vitals:  Vitals Value Taken Time  BP 102/44 06/23/21 1025  Temp    Pulse 63 06/23/21 1026  Resp 16 06/23/21 1026  SpO2 100 % 06/23/21 1026  Vitals shown include unvalidated device data.  Last Pain:  Vitals:   06/23/21 0559  TempSrc:   PainSc: 0-No pain      Patients Stated Pain Goal: 4 (81/77/11 6579)  Complications: No notable events documented.

## 2021-06-24 ENCOUNTER — Encounter (HOSPITAL_COMMUNITY): Payer: Self-pay | Admitting: Internal Medicine

## 2021-06-28 ENCOUNTER — Emergency Department (HOSPITAL_BASED_OUTPATIENT_CLINIC_OR_DEPARTMENT_OTHER): Payer: BC Managed Care – PPO

## 2021-06-28 ENCOUNTER — Telehealth: Payer: Self-pay | Admitting: Physician Assistant

## 2021-06-28 ENCOUNTER — Emergency Department (HOSPITAL_BASED_OUTPATIENT_CLINIC_OR_DEPARTMENT_OTHER)
Admission: EM | Admit: 2021-06-28 | Discharge: 2021-06-28 | Disposition: A | Discharge status: Home / Self Care | Payer: BC Managed Care – PPO | Attending: Emergency Medicine | Admitting: Emergency Medicine

## 2021-06-28 ENCOUNTER — Encounter (HOSPITAL_BASED_OUTPATIENT_CLINIC_OR_DEPARTMENT_OTHER): Payer: Self-pay | Admitting: Emergency Medicine

## 2021-06-28 ENCOUNTER — Telehealth: Payer: Self-pay | Admitting: Internal Medicine

## 2021-06-28 ENCOUNTER — Other Ambulatory Visit: Payer: Self-pay

## 2021-06-28 DIAGNOSIS — M79601 Pain in right arm: Secondary | ICD-10-CM

## 2021-06-28 DIAGNOSIS — M79621 Pain in right upper arm: Secondary | ICD-10-CM | POA: Diagnosis present

## 2021-06-28 DIAGNOSIS — R52 Pain, unspecified: Secondary | ICD-10-CM

## 2021-06-28 DIAGNOSIS — R002 Palpitations: Secondary | ICD-10-CM | POA: Diagnosis not present

## 2021-06-28 LAB — CBC WITH DIFFERENTIAL/PLATELET
Abs Immature Granulocytes: 0.02 10*3/uL (ref 0.00–0.07)
Basophils Absolute: 0 10*3/uL (ref 0.0–0.1)
Basophils Relative: 0 %
Eosinophils Absolute: 0.1 10*3/uL (ref 0.0–0.5)
Eosinophils Relative: 1 %
HCT: 38.7 % (ref 36.0–46.0)
Hemoglobin: 13.5 g/dL (ref 12.0–15.0)
Immature Granulocytes: 0 %
Lymphocytes Relative: 18 %
Lymphs Abs: 1.4 10*3/uL (ref 0.7–4.0)
MCH: 31.4 pg (ref 26.0–34.0)
MCHC: 34.9 g/dL (ref 30.0–36.0)
MCV: 90 fL (ref 80.0–100.0)
Monocytes Absolute: 0.5 10*3/uL (ref 0.1–1.0)
Monocytes Relative: 6 %
Neutro Abs: 5.9 10*3/uL (ref 1.7–7.7)
Neutrophils Relative %: 75 %
Platelets: 214 10*3/uL (ref 150–400)
RBC: 4.3 MIL/uL (ref 3.87–5.11)
RDW: 11.7 % (ref 11.5–15.5)
WBC: 7.9 10*3/uL (ref 4.0–10.5)
nRBC: 0 % (ref 0.0–0.2)

## 2021-06-28 LAB — BASIC METABOLIC PANEL
Anion gap: 8 (ref 5–15)
BUN: 8 mg/dL (ref 6–20)
CO2: 26 mmol/L (ref 22–32)
Calcium: 9.2 mg/dL (ref 8.9–10.3)
Chloride: 105 mmol/L (ref 98–111)
Creatinine, Ser: 0.81 mg/dL (ref 0.44–1.00)
GFR, Estimated: >60 mL/min (ref >60)
Glucose, Bld: 85 mg/dL (ref 70–99)
Potassium: 3.7 mmol/L (ref 3.5–5.1)
Sodium: 139 mmol/L (ref 135–145)

## 2021-06-28 LAB — TROPONIN I (HIGH SENSITIVITY)
Troponin I (High Sensitivity): 35 ng/L — ABNORMAL HIGH (ref <18)
Troponin I (High Sensitivity): 36 ng/L — ABNORMAL HIGH (ref <18)

## 2021-06-28 NOTE — Telephone Encounter (Signed)
Pt called stating her arm is numb and tingling where her IV was in place for ablation. Pt states her arm is not red, but pain in her lower bicep right above her elbow.   In addition, her HR was in the 140s last night and is sustaining in the 90s-110s. She states even with her PVCs, her heart never sustained heart rates this high.   Unclear if she has possibly cellulitis vs superficial thrombus in her right arm causing tachycardia; however, HR in the 140s seems unlikely due to infection. I recommended ER evaluation to rule out cellulitis vs thrombus in her arm in addition to EKG/telemetry to rule out new atrial arrhythmia. She will go to [facility].    Tami Lin Jamieson Hetland, PA-C 06/28/2021, 8:04 AM [phone] [facility]

## 2021-06-28 NOTE — ED Triage Notes (Signed)
Pt presents with Right arm numbness and discomfort x2 days, s/p heart ablation, Tuesday, January 3rd. Pt reports Right hand feeling really cold immediately post op and continues to have intermittent episodes Right hand feeling cold. Pt also having discomfort to her Right bicep, mild edema and discoloration noted to Right bicep. Pt also reports mid-sternum chest pain since ablation. Describes it as non radiating chest discomfort. Pt also having constant nausea that worsens after eating. Pt reports she awoke this am with a HR 140's

## 2021-06-28 NOTE — ED Notes (Addendum)
Attempted iv x1 unsuccessful left AC

## 2021-06-28 NOTE — Telephone Encounter (Signed)
Spoke to [place] MD   Arm does not look infected     Scan shows no DVT 2  When patient was in bed this moring had tachycardia   Watch said HR 140s   Seconds   Then down to 110s        Pt being sent home from [facility]   She just had ablation

## 2021-06-28 NOTE — ED Notes (Signed)
Ultrasound in room

## 2021-06-28 NOTE — ED Notes (Signed)
Dc instructions reviewed with patient. Patient voiced understanding. Dc with belongings.

## 2021-06-28 NOTE — ED Provider Notes (Signed)
[facility] EMERGENCY DEPT Provider Note   CSN: [id number] Arrival date & time: 06/28/21  0859     History  Chief Complaint  Patient presents with   Post-op Problem    Dana Graves is a 46 y.o. female.  Patient is a 46 yo female with pmh of WPW s/p day 5 ablation presenting for palpitations. Pt admits to elevated HR at home of 140bpm max and average 110-120bpm. Denies chest pain or sob. States she has pain in biceps on right arm where IV was placed. Denis redness, swelling, or drainage. Admits to right hand feeling different than left, describes "cold" like sensation. Denies discoloration or motor deficits.  The history is provided by the patient. No language interpreter was used.      Home Medications Prior to Admission medications   Medication Sig Start Date End Date Taking? Authorizing Provider  Cyanocobalamin (VITAMIN B-12 IJ) Inject 1,000 mg as directed. Patient not taking: Reported on 03/04/2021    [provider]  KURVELO 0.15-30 MG-MCG tablet Take 1 tablet by mouth in the morning. 05/09/19   [provider]  omeprazole (PRILOSEC) 20 MG capsule Take 2 capsules (40 mg total) by mouth daily. Patient taking differently: Take 20 mg by mouth in the morning. 05/26/20   Evans Lance, MD      Allergies    Patient has no known allergies.    Review of Systems   Review of Systems  Constitutional:  Negative for chills and fever.  HENT:  Negative for ear pain and sore throat.   Eyes:  Negative for pain and visual disturbance.  Respiratory:  Negative for cough and shortness of breath.   Cardiovascular:  Positive for palpitations. Negative for chest pain.  Gastrointestinal:  Negative for abdominal pain and vomiting.  Genitourinary:  Negative for dysuria and hematuria.  Musculoskeletal:  Negative for arthralgias and back pain.  Skin:  Negative for color change and rash.  Neurological:  Negative for seizures and syncope.  All other systems  reviewed and are negative.  Physical Exam Updated Vital Signs BP 131/74 (BP Location: Left Arm)    Pulse 87    Temp 99 F (37.2 C) (Oral)    Resp 15    SpO2 100%  Physical Exam Vitals and nursing note reviewed.  Constitutional:      General: She is not in acute distress.    Appearance: She is well-developed.  HENT:     Head: Normocephalic and atraumatic.  Eyes:     Conjunctiva/sclera: Conjunctivae normal.  Cardiovascular:     Rate and Rhythm: Normal rate and regular rhythm.     Heart sounds: No murmur heard. Pulmonary:     Effort: Pulmonary effort is normal. No respiratory distress.     Breath sounds: Normal breath sounds.  Abdominal:     Palpations: Abdomen is soft.     Tenderness: There is no abdominal tenderness.  Musculoskeletal:        General: No swelling.     Cervical back: Neck supple.  Skin:    General: Skin is warm and dry.     Capillary Refill: Capillary refill takes less than 2 seconds.  Neurological:     Mental Status: She is alert.  Psychiatric:        Mood and Affect: Mood normal.    ED Results / Procedures / Treatments   Labs (all labs ordered are listed, but only abnormal results are displayed) Labs Reviewed  BASIC METABOLIC PANEL  CBC WITH  DIFFERENTIAL/PLATELET  TROPONIN I (HIGH SENSITIVITY)    EKG None  Radiology No results found.  Procedures Procedures    Medications Ordered in ED Medications - No data to display  ED Course/ Medical Decision Making/ A&P                           Medical Decision Making  9:24 AM 46 yo female with pmh of WPW s/p day 5 ablation presenting for palpitations. ECG demonstrates NSR. Pt observed in ED for 5 hours with no telemetry evidence of arrhythmias. Troponin elevated but minimally and likely secondary to recent ablation. I spoke with patient's cardiology team who recommends f/u in office first thing tomorrow morning.   Physical exam of right arm demonstrates no erythema, swelling, warmth, or drainage.  Tender to palpation approx 2 cm proximal to recent IV puncture site. US demonstrates no thrombophlebitis. No DVT.   Patient in no distress and overall condition improved here in the ED. Detailed discussions were had with the patient regarding current findings, and need for close f/u with PCP or on call doctor. The patient has been instructed to return immediately if the symptoms worsen in any way for re-evaluation. Patient verbalized understanding and is in agreement with current care plan. All questions answered prior to discharge.          Final Clinical Impression(s) / ED Diagnoses Final diagnoses:  Pain  Palpitations  Pain of right upper extremity-at IV site    Rx / DC Orders ED Discharge Orders     None         Lianne Cure, DO 42/87/68 1403

## 2021-07-06 ENCOUNTER — Telehealth: Payer: Self-pay | Admitting: Internal Medicine

## 2021-07-06 ENCOUNTER — Ambulatory Visit (INDEPENDENT_AMBULATORY_CARE_PROVIDER_SITE_OTHER): Payer: BC Managed Care – PPO

## 2021-07-06 DIAGNOSIS — R002 Palpitations: Secondary | ICD-10-CM

## 2021-07-06 NOTE — Telephone Encounter (Signed)
Pt reaching out to speak w/ one of the nurses.. recently had an ablation done and feels like something isnt right... please advise

## 2021-07-06 NOTE — Telephone Encounter (Signed)
Complaining of elevated heart rate on and off since post ablation 06/23/21. Heart rate has ranged between 61- 137 today . Current 89. 134/77  Of note ED on the 06/28/21 for fast HR and [place] Right upper arm- DVT neg. From note: 46 yo female with pmh of WPW s/p day 5 ablation presenting for palpitations. ECG demonstrates NSR. Pt observed in ED for 5 hours with no telemetry evidence of arrhythmias. Troponin elevated but minimally and likely secondary to recent ablation. I spoke with patient's cardiology team who recommends f/u in office first thing tomorrow morning.  Patient also went to ER in [place] on 07/04/21. Patient stated they took blood work which was good, Chest xray was good, EKG - Sinus tachy 104. She said she is going to send an attachment over mychart with the EKG's. In [place] they talked to the patient about restarting verapamil but wanted our office/providers to make that decision.   Patients only current symptom is palpations. Did note that she did feel like she was going to pass out one time at night on 07/03/21, that has not happened again and her daughter gave her a cold wash cloth which made her feel instantly better.   ED precautions given.   Will forward to MD and PA for advisement.

## 2021-07-06 NOTE — Telephone Encounter (Signed)
Shirley Friar, PA-C     Discussed with Dr. Lovena Le.  Please order 3 day Zio so we can determine what exactly we would be treating before we mask it by restarting her previous medications.   Thank you!   Ordered Zio, verified home address.  Patient verbalized understanding and agreement.

## 2021-07-06 NOTE — Progress Notes (Unsigned)
Enrolled for [employer] to mail a ZIO XT long term holter monitor to the patients address on file.  Dr. Lovena Le to read.

## 2021-07-11 DIAGNOSIS — R002 Palpitations: Secondary | ICD-10-CM

## 2021-07-23 ENCOUNTER — Ambulatory Visit (INDEPENDENT_AMBULATORY_CARE_PROVIDER_SITE_OTHER): Payer: BC Managed Care – PPO | Admitting: Internal Medicine

## 2021-07-23 ENCOUNTER — Other Ambulatory Visit: Payer: Self-pay

## 2021-07-23 DIAGNOSIS — I493 Ventricular premature depolarization: Secondary | ICD-10-CM | POA: Diagnosis not present

## 2021-07-23 DIAGNOSIS — I456 Pre-excitation syndrome: Secondary | ICD-10-CM | POA: Diagnosis not present

## 2021-07-23 NOTE — Patient Instructions (Addendum)
Medication Instructions:  Your physician recommends that you continue on your current medications as directed. Please refer to the Current Medication list given to you today.  Labwork: None ordered.  Testing/Procedures: None ordered.  Follow-Up: Your physician wants you to follow-up in: 6 months with Cristopher Peru, MD   01/26/2022 at 9;45 am   Any Other Special Instructions Will Be Listed Below (If Applicable).  If you need a refill on your cardiac medications before your next appointment, please call your pharmacy.

## 2021-07-23 NOTE — Progress Notes (Signed)
HPI Mrs. Dana Graves returns today for followup. She is a pleasant 46 yo woman with a h/o WPW who has had intermitent pre-excitation over the years but then developed worsening PVC's for which she underwent EP study and catheter ablation. During the procedure she had no AP conduction but did have very frequent PVC's which were targeted in the RVOT and successfuly ablated. In the interim she has had more palpitations and a cardiac monitor demonstrates that she is having symptomatic conduction over her AP. One PVC was observed. Overall she is better.  No Known Allergies   Current Outpatient Medications  Medication Sig Dispense Refill   Cyanocobalamin (VITAMIN B-12 IJ) Inject 1,000 mg as directed. (Patient not taking: Reported on 03/04/2021)     KURVELO 0.15-30 MG-MCG tablet Take 1 tablet by mouth in the morning.     omeprazole (PRILOSEC) 20 MG capsule Take 2 capsules (40 mg total) by mouth daily. (Patient taking differently: Take 20 mg by mouth in the morning.) 60 capsule 11   No current facility-administered medications for this visit.     Past Medical History:  Diagnosis Date   B12 deficiency    Dyspareunia    Pelvic pain in female    PVC's (premature ventricular contractions)    Seasonal allergies    Ventricular pre-excitation    Wears contact lenses    WPW (Wolff-Parkinson-White syndrome)     ROS:   All systems reviewed and negative except as noted in the HPI.   Past Surgical History:  Procedure Laterality Date   BREAST ENHANCEMENT SURGERY  feb  2002   ESOPHAGOGASTRODUODENOSCOPY N/A 02/16/2021   Procedure: ESOPHAGOGASTRODUODENOSCOPY (EGD);  Surgeon: Toledo, Benay Pike, MD;  Location: [facility];  Service: Gastroenterology;  Laterality: N/A;   LAPAROSCOPY N/A 12/13/2014   Procedure: LAPAROSCOPY DIAGNOSTIC ;  Surgeon: Marylynn Pearson, MD;  Location: [facility];  Service: Gynecology;  Laterality: N/A;   LAPAROSCOPY FOR ECTOPIC PREGNANCY  2009 approx.    Unilateral Salpingostomy   PVC ABLATION N/A 06/23/2021   Procedure: PVC ABLATION;  Surgeon: Evans Lance, MD;  Location: [facility];  Service: Cardiovascular;  Laterality: N/A;   V TACH ABLATION N/A 06/23/2021   Procedure: WPW ABLATION;  Surgeon: Evans Lance, MD;  Location: [facility];  Service: Cardiovascular;  Laterality: N/A;     Family History  Problem Relation Age of Onset   Breast cancer Maternal Grandmother    Colon cancer Paternal Grandfather    Diabetes Mother    Colon polyps Father    Heart attack Maternal Grandfather      Social History   Socioeconomic History   Marital status: Married    Spouse name: Not on file   Number of children: 2   Years of education: Not on file   Highest education level: Not on file  Occupational History   Occupation: work from home  Tobacco Use   Smoking status: Never   Smokeless tobacco: Never  Vaping Use   Vaping Use: Never used  Substance and Sexual Activity   Alcohol use: Yes    Alcohol/week: 7.0 standard drinks    Types: 7 Glasses of wine per week    Comment: 1-2 DAILY WINE   Drug use: No   Sexual activity: Yes    Birth control/protection: Pill  Other Topics Concern   Not on file  Social History Narrative   Not on file   Social Determinants of Radio broadcast assistant  Strain: Not on file  Food Insecurity: Not on file  Transportation Needs: Not on file  Physical Activity: Not on file  Stress: Not on file  Social Connections: Not on file  Intimate Partner Violence: Not on file     P - 84, R - 16  Physical Exam:  Well appearing NAD HEENT: Unremarkable Neck:  No JVD, no thyromegally Lymphatics:  No adenopathy Back:  No CVA tenderness Lungs:  Clear with no wheezes HEART:  Regular rate rhythm, no murmurs, no rubs, no clicks Abd:  soft, positive bowel sounds, no organomegally, no rebound, no guarding Ext:  2 plus pulses, no edema, no cyanosis, no clubbing Skin:  No rashes no  nodules Neuro:  CN II through XII intact, motor grossly intact  EKG - NSR with no PVC's and subtle pre-excitation  Assess/Plan:  PVC's  - she is s/p catheter ablation of RVOT PVC's and has not had any significant recurrence.  WPW - she had no pre-excitation of evidence of pathway conduction during her EP study. Her cardiac monitor demonstrated intermittent pathway conduction. I discussed the benign nature and treatment options and recommended watchful waiting.   Carleene Overlie Jonnae Fonseca,MD

## 2021-07-27 NOTE — Addendum Note (Signed)
Addended by: Gwendlyn Deutscher on: 07/27/2021 04:55 PM   Modules accepted: Orders

## 2021-08-06 MED ORDER — METOPROLOL TARTRATE 25 MG PO TABS: 25.0000 mg | ORAL_TABLET | ORAL | 0 refills | Status: DC | PRN

## 2021-08-06 NOTE — Addendum Note (Signed)
Addended by: Willeen Cass A on: 08/06/2021 02:52 PM   Modules accepted: Orders

## 2021-09-11 ENCOUNTER — Encounter: Payer: Self-pay | Admitting: Internal Medicine

## 2021-10-27 LAB — COLOGUARD: COLOGUARD: NEGATIVE

## 2021-10-27 LAB — EXTERNAL GENERIC LAB PROCEDURE: COLOGUARD: NEGATIVE

## 2022-01-26 ENCOUNTER — Ambulatory Visit (INDEPENDENT_AMBULATORY_CARE_PROVIDER_SITE_OTHER): Payer: BC Managed Care – PPO | Admitting: Internal Medicine

## 2022-01-26 ENCOUNTER — Encounter: Payer: Self-pay | Admitting: Internal Medicine

## 2022-01-26 VITALS — BP 110/80 | HR 82 | Ht 68.0 in | Wt 153.8 lb

## 2022-01-26 DIAGNOSIS — I456 Pre-excitation syndrome: Secondary | ICD-10-CM

## 2022-01-26 DIAGNOSIS — I493 Ventricular premature depolarization: Secondary | ICD-10-CM

## 2022-01-26 NOTE — Progress Notes (Signed)
HPI Ms. Dana Graves returns today for followup. She is a pleasant 46 yo woman with a h/o WPW pattern on her ECG who's AP conduction has been intermittent. She the developed worsening PVC's and underwent EP study and ablation of the PVC's which were located on the septal side of the RVOT just under the pulmonic valve. I saw her a month later and despite no evidence of AP conduction during the ep study, she had some pre-excitation on her ECG. I recommended watchful waiting and discuss the benign nature of her symptoms.  No Known Allergies   Current Outpatient Medications  Medication Sig Dispense Refill   KURVELO 0.15-30 MG-MCG tablet Take 1 tablet by mouth in the morning.     Cyanocobalamin (VITAMIN B-12 IJ) Inject 1,000 mg as directed. (Patient not taking: Reported on 03/04/2021)     metoprolol tartrate (LOPRESSOR) 25 MG tablet Take 1 tablet (25 mg total) by mouth as needed. (Patient not taking: Reported on 01/26/2022) 30 tablet 0   omeprazole (PRILOSEC) 20 MG capsule Take 2 capsules (40 mg total) by mouth daily. (Patient not taking: Reported on 01/26/2022) 60 capsule 11   No current facility-administered medications for this visit.     Past Medical History:  Diagnosis Date   B12 deficiency    Dyspareunia    Pelvic pain in female    PVC's (premature ventricular contractions)    Seasonal allergies    Ventricular pre-excitation    Wears contact lenses    WPW (Wolff-Parkinson-White syndrome)     ROS:   All systems reviewed and negative except as noted in the HPI.   Past Surgical History:  Procedure Laterality Date   BREAST ENHANCEMENT SURGERY  feb  2002   ESOPHAGOGASTRODUODENOSCOPY N/A 02/16/2021   Procedure: ESOPHAGOGASTRODUODENOSCOPY (EGD);  Surgeon: Toledo, Benay Pike, MD;  Location: [facility];  Service: Gastroenterology;  Laterality: N/A;   LAPAROSCOPY N/A 12/13/2014   Procedure: LAPAROSCOPY DIAGNOSTIC ;  Surgeon: Marylynn Pearson, MD;  Location: [facility];   Service: Gynecology;  Laterality: N/A;   LAPAROSCOPY FOR ECTOPIC PREGNANCY  2009 approx.   Unilateral Salpingostomy   PVC ABLATION N/A 06/23/2021   Procedure: PVC ABLATION;  Surgeon: Evans Lance, MD;  Location: [place];  Service: Cardiovascular;  Laterality: N/A;   V TACH ABLATION N/A 06/23/2021   Procedure: WPW ABLATION;  Surgeon: Evans Lance, MD;  Location: [facility] [facility];  Service: Cardiovascular;  Laterality: N/A;     Family History  Problem Relation Age of Onset   Breast cancer Maternal Grandmother    Colon cancer Paternal Grandfather    Diabetes Mother    Colon polyps Father    Heart attack Maternal Grandfather      Social History   Socioeconomic History   Marital status: Married    Spouse name: Not on file   Number of children: 2   Years of education: Not on file   Highest education level: Not on file  Occupational History   Occupation: work from home  Tobacco Use   Smoking status: Never   Smokeless tobacco: Never  Vaping Use   Vaping Use: Never used  Substance and Sexual Activity   Alcohol use: Yes    Alcohol/week: 7.0 standard drinks of alcohol    Types: 7 Glasses of wine per week    Comment: 1-2 DAILY WINE   Drug use: No   Sexual activity: Yes    Birth control/protection: Pill  Other Topics Concern  Not on file  Social History Narrative   Not on file   Social Determinants of Health   Financial Resource Strain: Not on file  Food Insecurity: Not on file  Transportation Needs: Not on file  Physical Activity: Not on file  Stress: Not on file  Social Connections: Not on file  Intimate Partner Violence: Not on file     BP 110/80   Pulse 82   Ht '5\' 8"'$  (1.727 m)   Wt 153 lb 12.8 oz (69.8 kg)   SpO2 99%   BMI 23.39 kg/m   Physical Exam:  Well appearing NAD HEENT: Unremarkable Neck:  No JVD, no thyromegally Lymphatics:  No adenopathy Back:  No CVA tenderness Lungs:  Clear HEART:  Regular rate rhythm, no murmurs, no rubs,  no clicks Abd:  soft, positive bowel sounds, no organomegally, no rebound, no guarding Ext:  2 plus pulses, no edema, no cyanosis, no clubbing Skin:  No rashes no nodules Neuro:  CN II through XII intact, motor grossly intact  EKG - nsr with WPW   Assess/Plan:  PVC's - she has none on the ECG and no symptoms. She will undergo watchful waiting. WPW - despite no AP conduction when we did her PVC ablation, she has evidence of a posteroseptal AP on her last 2 ecgs. She is essentially asymptomatic and will undergo watchful waiting. I asked her to call us if she were to develop HR's in the 170 range or higher which would indicate that she is having SVT which we have not documented.  Dana Overlie Quetzally Callas,MD

## 2022-01-26 NOTE — Patient Instructions (Addendum)
Medication Instructions:  Your physician recommends that you continue on your current medications as directed. Please refer to the Current Medication list given to you today.  *If you need a refill on your cardiac medications before your next appointment, please call your pharmacy*  Lab Work: None ordered.  If you have labs (blood work) drawn today and your tests are completely normal, you will receive your results only by: [place] (if you have MyChart) OR A paper copy in the mail If you have any lab test that is abnormal or we need to change your treatment, we will call you to review the results.  Testing/Procedures: None ordered.  Follow-Up: In 1 Year with Dr. Lovena Le    We recommend signing up for the patient portal called "MyChart".  Sign up information is provided on this After Visit Summary.  MyChart is used to connect with patients for Virtual Visits (Telemedicine).  Patients are able to view lab/test results, encounter notes, upcoming appointments, etc.  Non-urgent messages can be sent to your provider as well.   To learn more about what you can do with MyChart, go to [email or web address].    Provider:   Cristopher Peru, MD{or one of the following Advanced Practice Providers on your designated Care Team:   Tommye Standard, [place] Legrand Como "Jonni Sanger" Chalmers Cater, [place]    Important Information About Sugar

## 2022-03-16 ENCOUNTER — Encounter: Payer: Self-pay | Admitting: Internal Medicine

## 2022-05-20 IMAGING — MG MM DIGITAL DIAGNOSTIC UNILAT*L* IMPLANT W/ TOMO W/ CAD
4 series · 4 of 12 positions shown · non-contrast
Comparison: Previous exam(s).

CLINICAL DATA: Screening recall for a possible left breast mass.

EXAM:
DIGITAL DIAGNOSTIC UNILATERAL LEFT MAMMOGRAM WITH IMPLANTS, CAD AND
TOMOSYNTHESIS; ULTRASOUND LEFT BREAST LIMITED
TECHNIQUE: Left digital diagnostic mammography and breast tomosynthesis was
performed. The images were evaluated with computer-aided detection.
Standard and/or implant displaced views were performed.; Targeted
ultrasound examination of the left breast was performed.

[L MLO synth-2D]
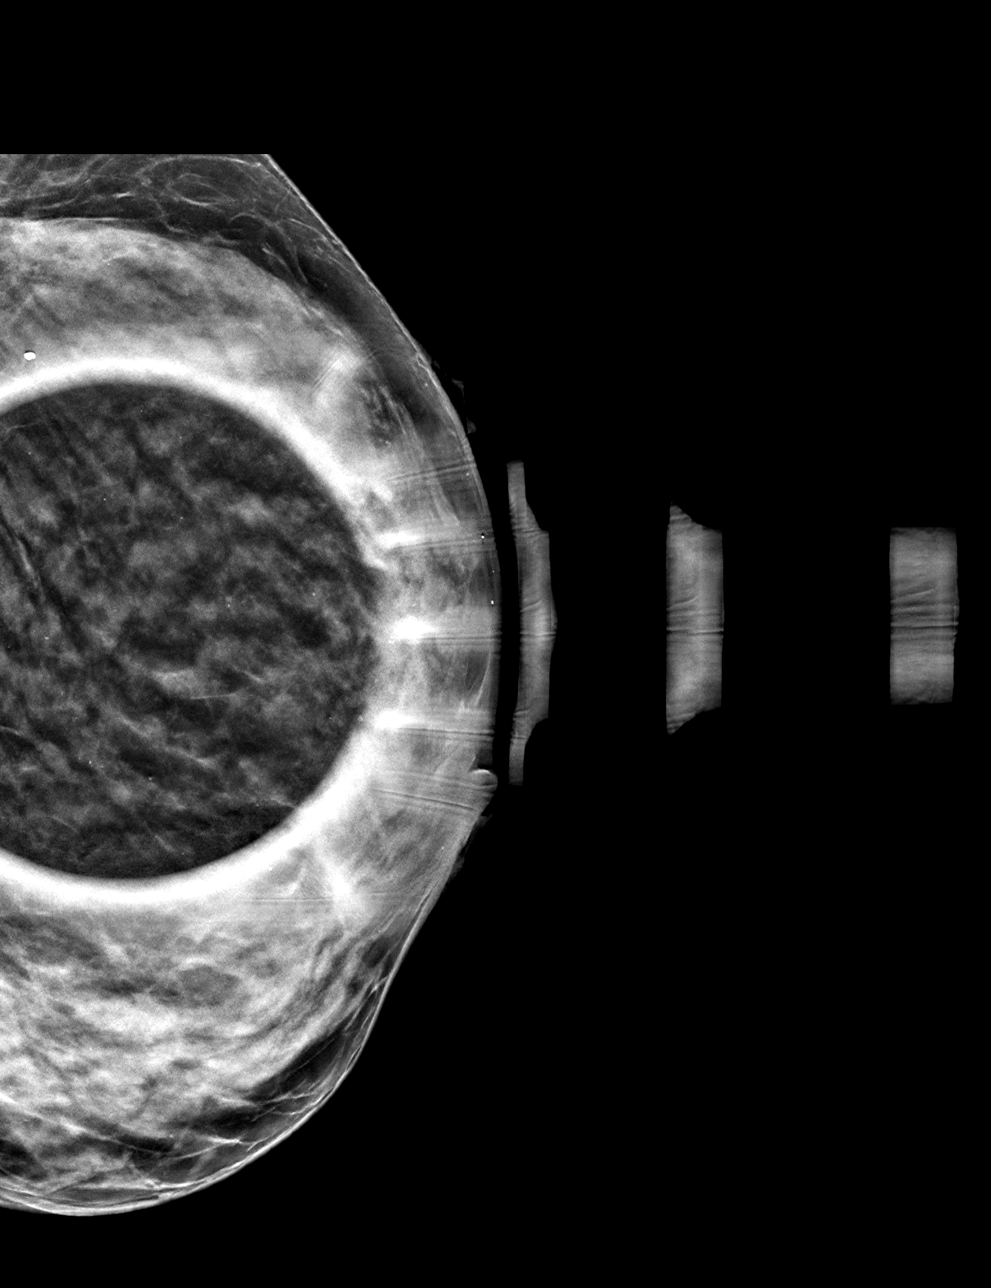

[L CC synth-2D]
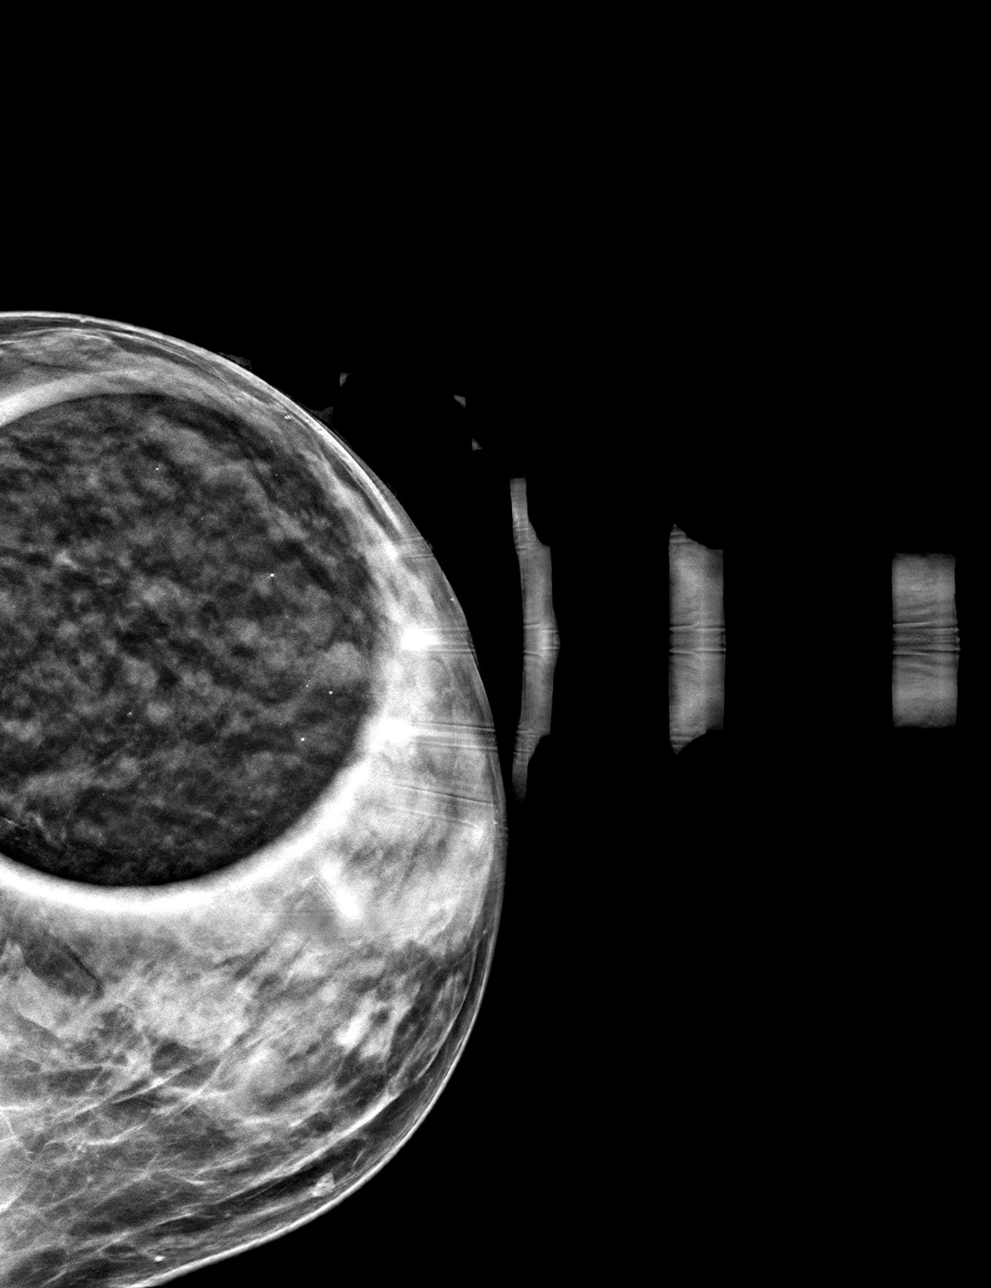

[L MLOID BREAST TOMOSYNTHESIS IMAGE tomo · tomo slice 24/47.0]
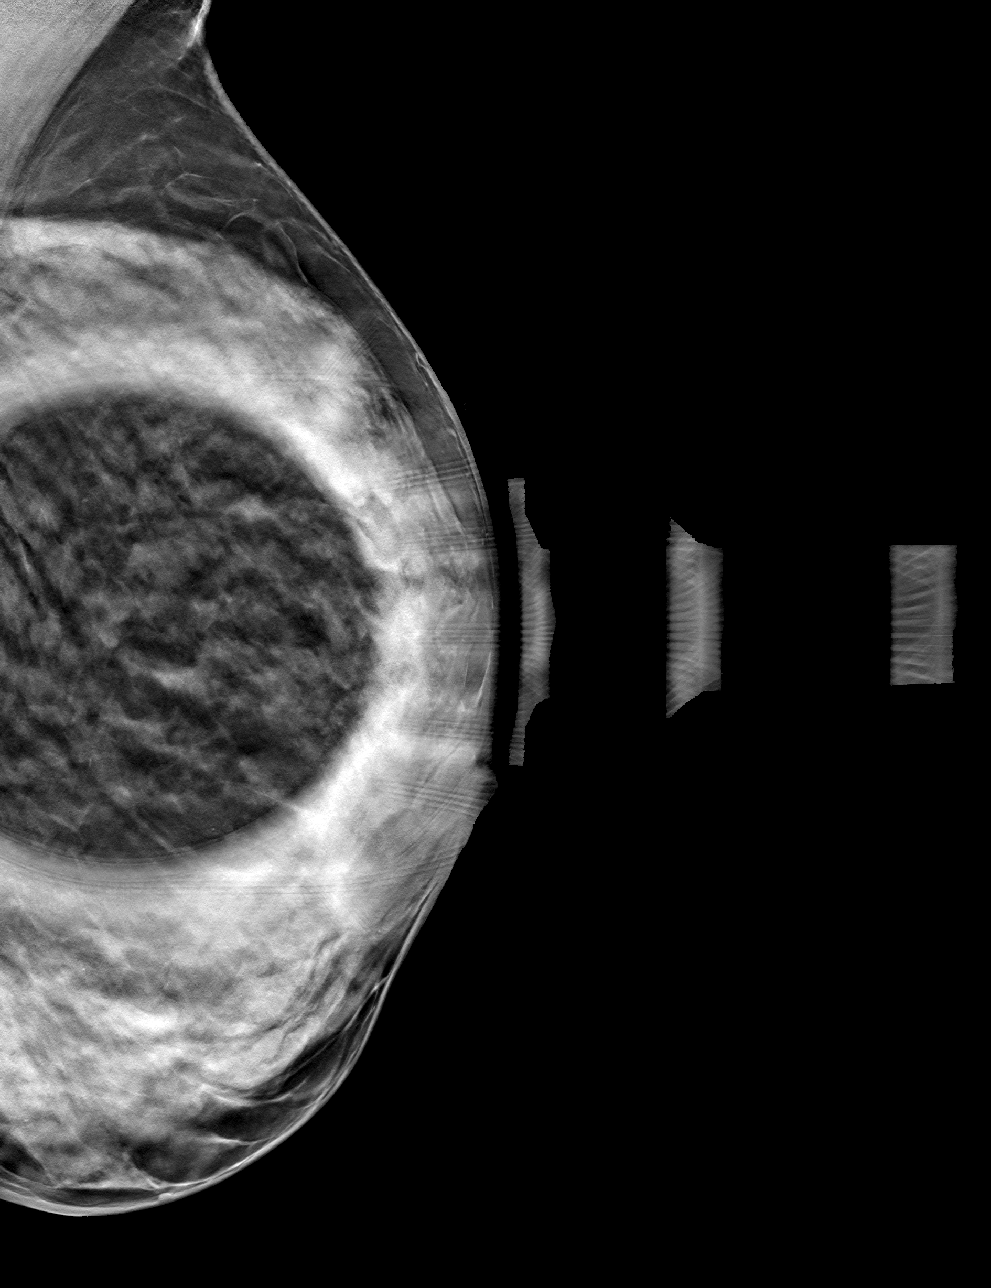

[L CCID BREAST TOMOSYNTHESIS IMAGE tomo · tomo slice 23/46.0]
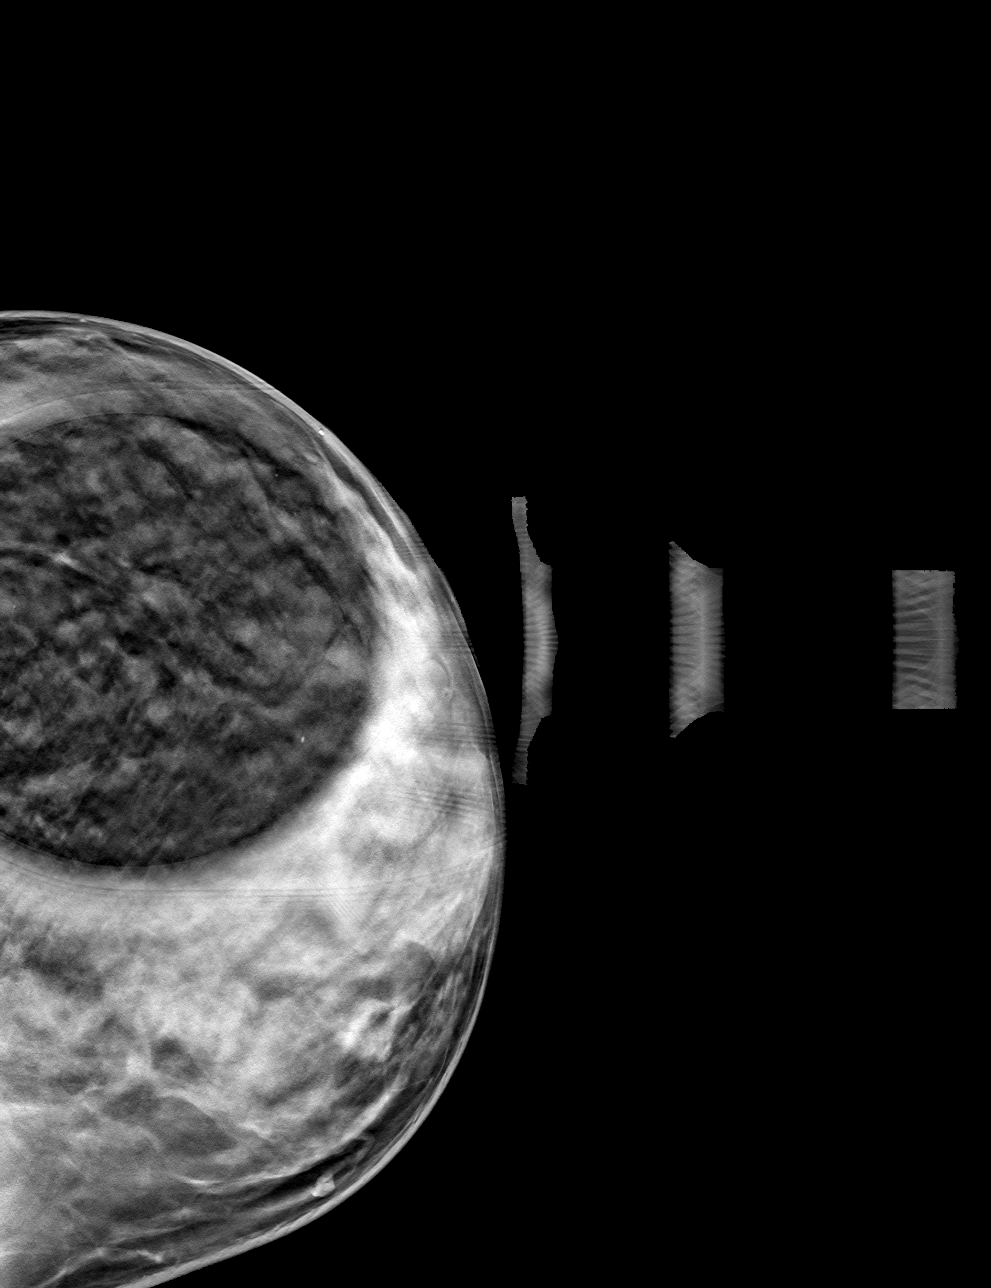

[4 of 12 positions shown; findings below may reference images not displayed]

ACR Breast Density Category d: The breast tissue is extremely dense,
which lowers the sensitivity of mammography.
FINDINGS: On the spot-compression diagnostic images, the possible mass
disperses. There is no defined residual mass, no significant areas
of asymmetry and no architectural distortion. There also no
suspicious calcifications. The patient has intact, retropectoral
saline implants.

Targeted left breast ultrasound is performed, showing normal
fibroglandular tissue throughout the lateral left breast. There is a
normal intramammary lymph node superficially at 2:30 o'clock, 3 cm
the nipple. There are no suspicious masses.
IMPRESSION: 1. No evidence of breast malignancy.

RECOMMENDATION:
Screening mammogram in one year.(Code:1C-Y-AGD)

I have discussed the findings and recommendations with the patient.
If applicable, a reminder letter will be sent to the patient
regarding the next appointment.

BI-RADS CATEGORY  1: Negative.

## 2022-08-27 IMAGING — US US EXTREM LOW VENOUS*L*
1 series · 13 of 24 positions shown · non-contrast
Comparison: None.

CLINICAL DATA: Left calf pain.



[Series 1: us venous img lower uni left (dvt) · portal-venous · 13 of 42 slices shown]
[im 1/42]
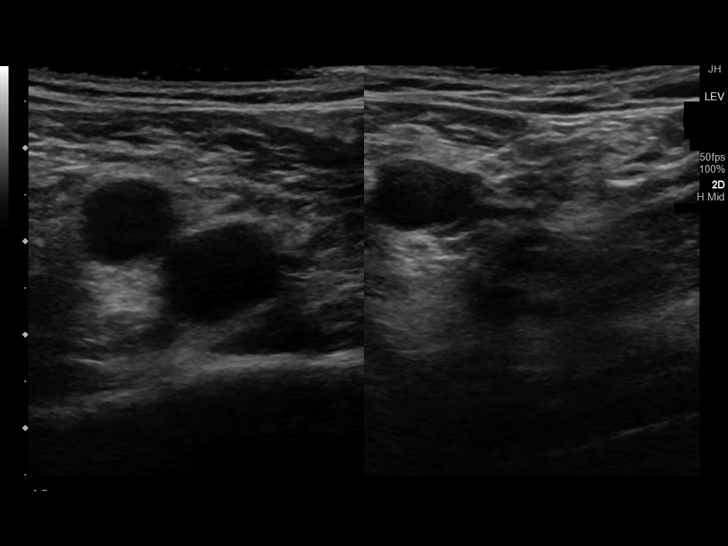
[im 4/42]
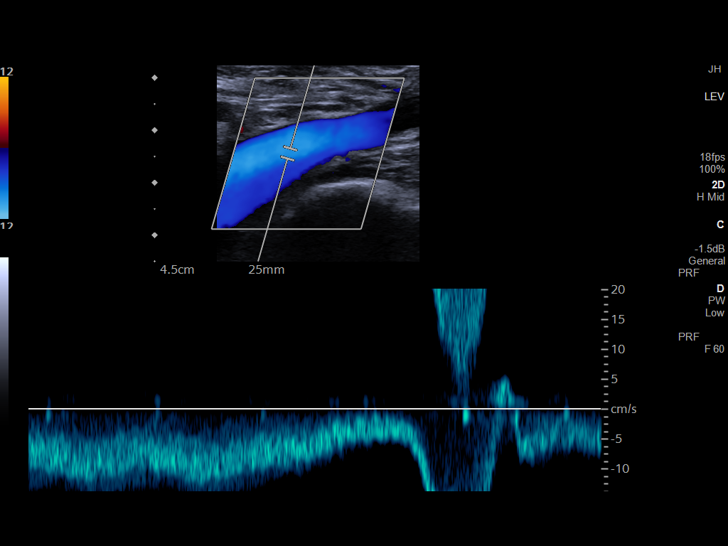
[im 8/42]
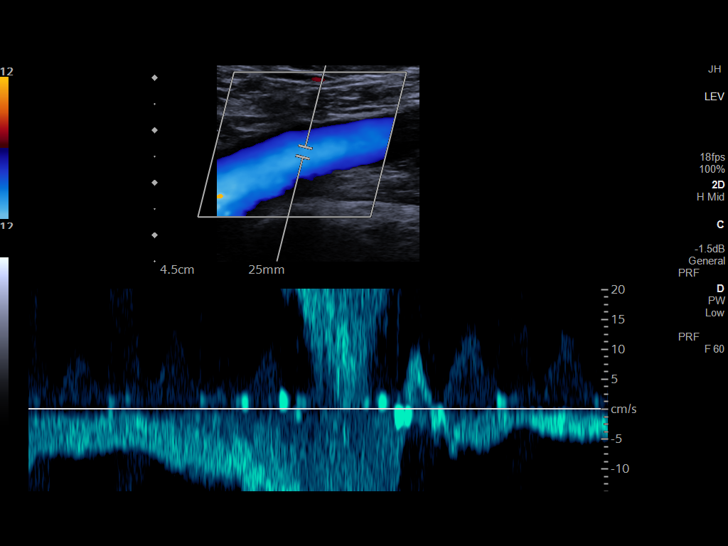
[im 11/42]
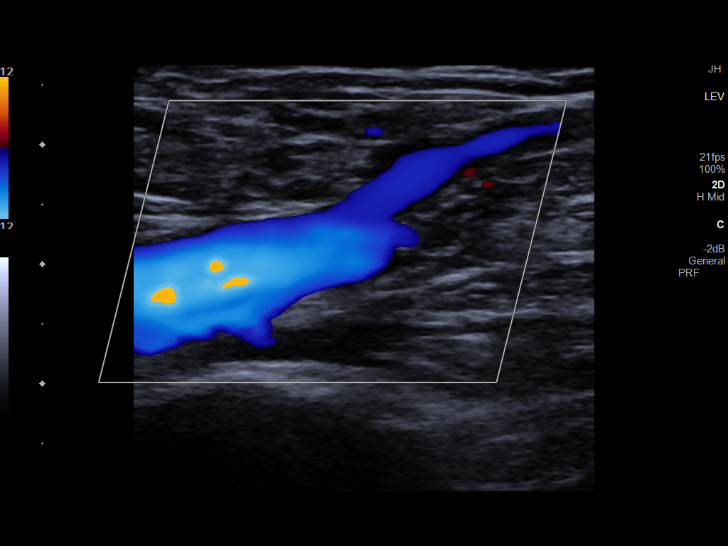
[im 15/42]
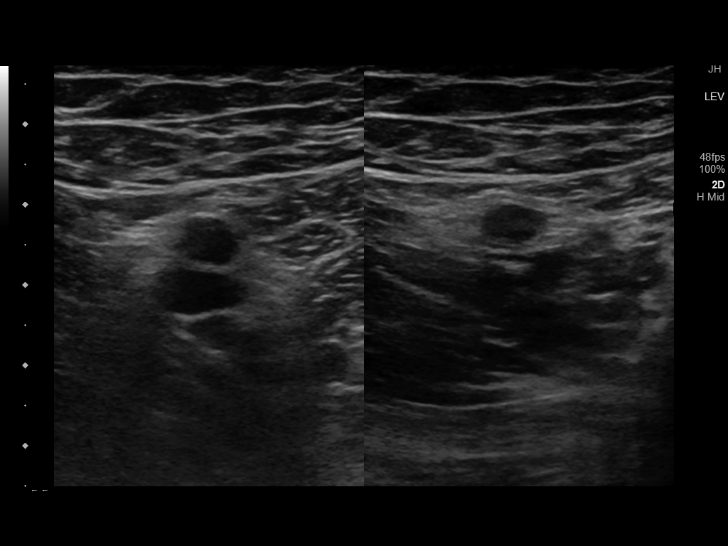
[im 18/42]
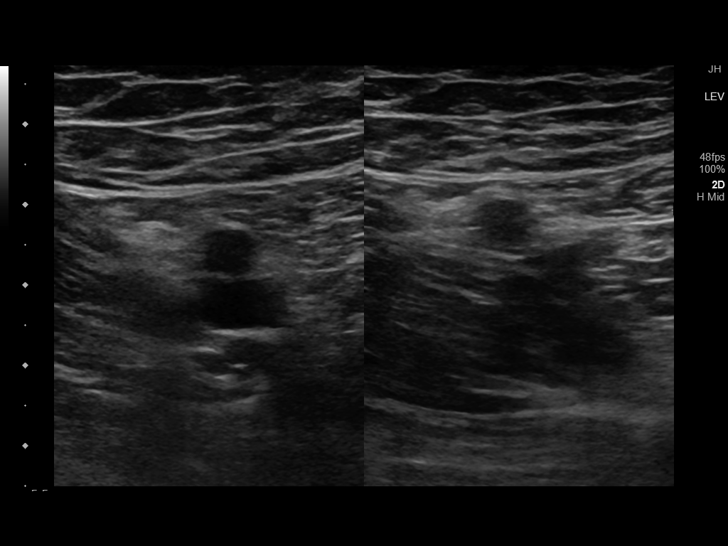
[im 22/42]
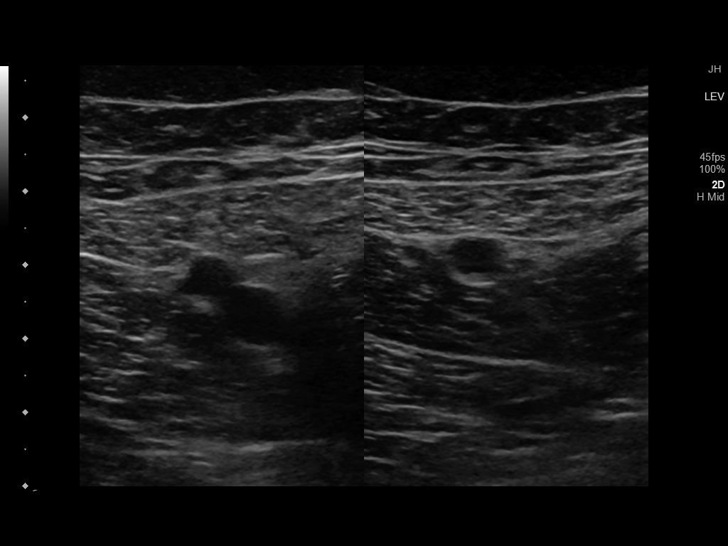
[im 24/42]
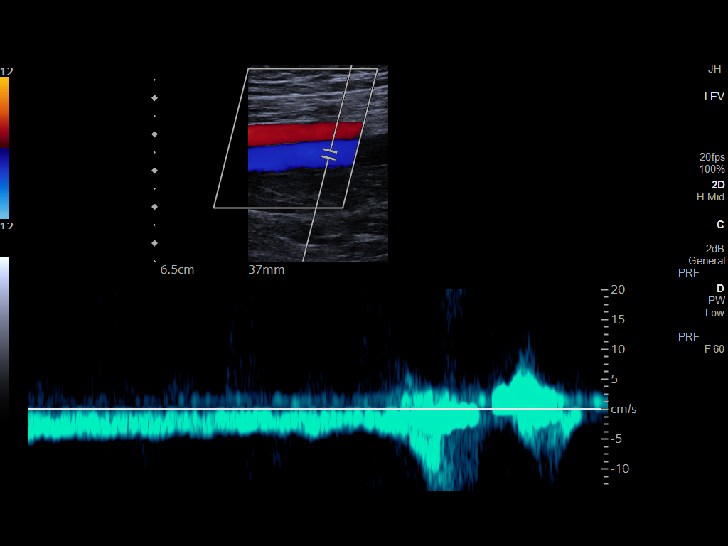
[im 27/42]
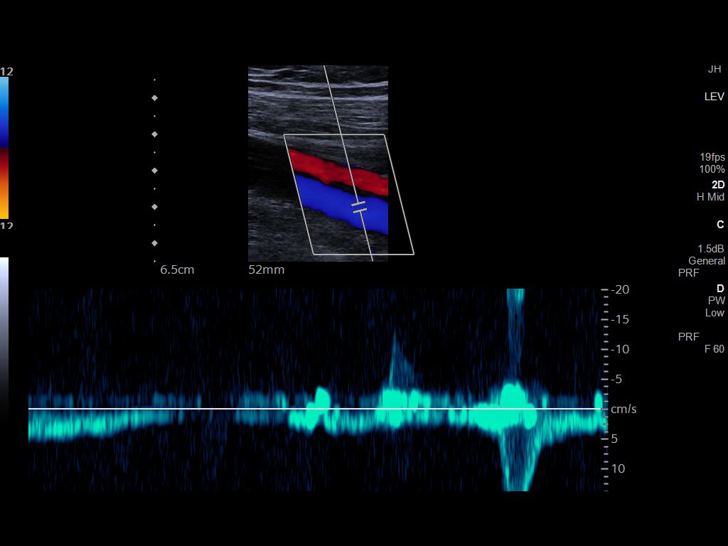
[im 31/42]
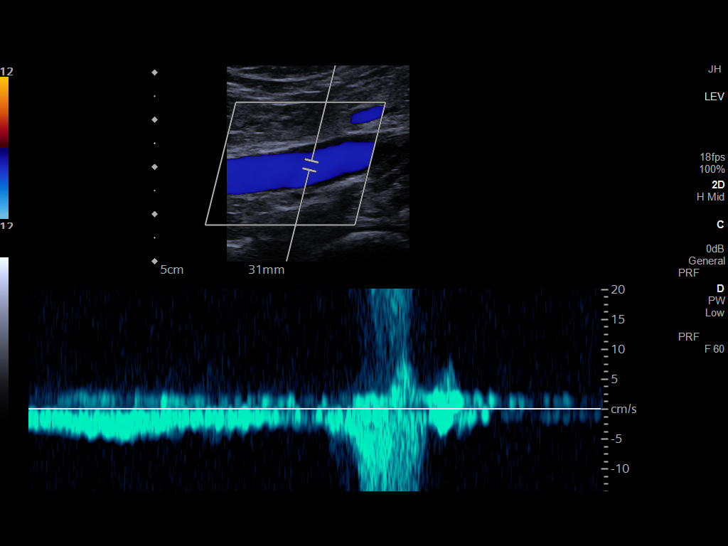
[im 34/42]
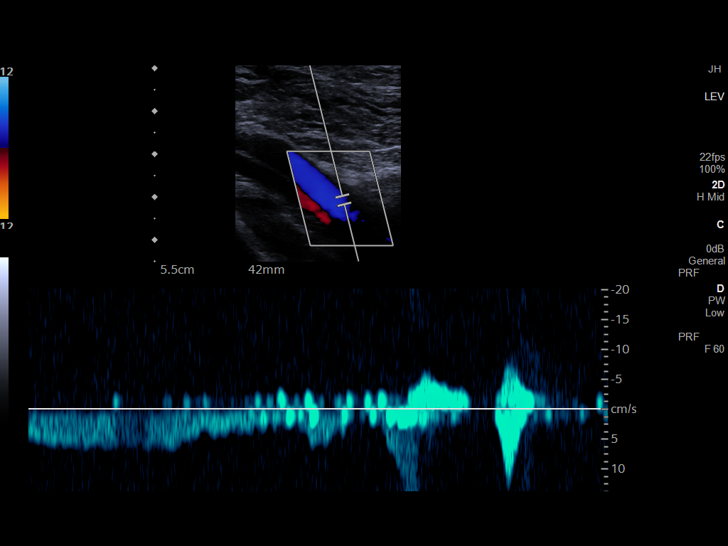
[im 38/42]
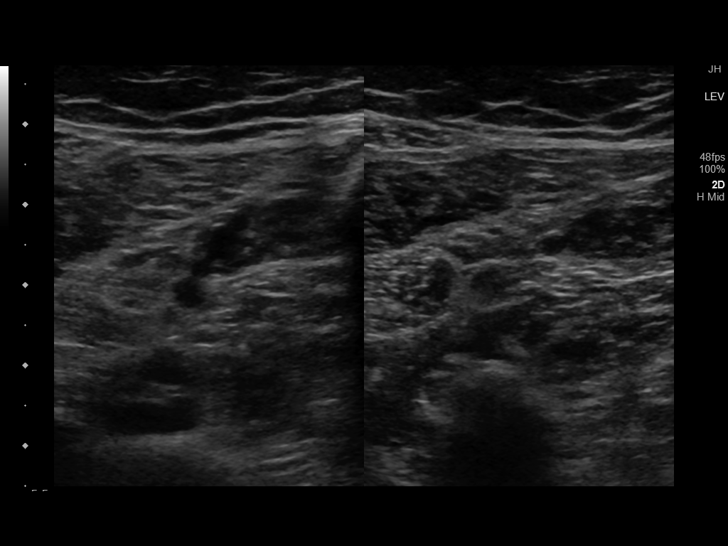
[im 42/42]
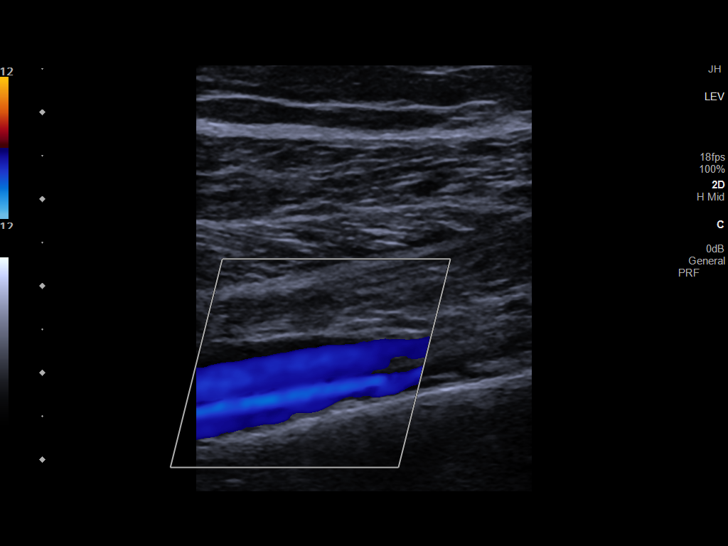

[13 of 24 positions shown; findings below may reference images not displayed]

FINDINGS: Contralateral Common Femoral Vein: Respiratory phasicity is normal
and symmetric with the symptomatic side. No evidence of thrombus.
Normal compressibility.

Common Femoral Vein: No evidence of thrombus. Normal
compressibility, respiratory phasicity and response to augmentation.

Saphenofemoral Junction: No evidence of thrombus. Normal
compressibility and flow on color Doppler imaging.

Profunda Femoral Vein: No evidence of thrombus. Normal
compressibility and flow on color Doppler imaging.

Femoral Vein: No evidence of thrombus. Normal compressibility,
respiratory phasicity and response to augmentation.

Popliteal Vein: No evidence of thrombus. Normal compressibility,
respiratory phasicity and response to augmentation.

Calf Veins: No evidence of thrombus. Normal compressibility and flow
on color Doppler imaging.

Other Findings:  None.
IMPRESSION: Negative for deep venous thrombosis in left lower extremity.

## 2022-11-29 ENCOUNTER — Encounter: Payer: Self-pay | Admitting: Internal Medicine

## 2022-11-29 ENCOUNTER — Telehealth: Payer: Self-pay

## 2022-11-29 DIAGNOSIS — R079 Chest pain, unspecified: Secondary | ICD-10-CM

## 2022-11-29 NOTE — Telephone Encounter (Signed)
MyChart message received in [facility] Triage.    Pt stated that she has had chest pain / tightness off and on for about two weeks. Pt states she has no other symptoms, and pain / tights moves from side to side or is midline.  Pt asked pain on 1 to 10 scale, is only a 2, and requested an appointment with [facility] to see a provider.    Pt advised I can do this, and a provider office visit to assess her, via EKG / Labs and eval of meds is needed at this time.  However, Pt advised to go to the nearest ER for providers care, as chest pain / tightness can only be r/o by testing in the ER.  Pt verbalized understanding, but wants an office visit at this time.    I will forward a request to [facility] scheduling for an office visit with MD or APP.  Pt advised prior to ending call, if chest pain / tightness worsens, or she gets an additional symptom, to GO immediately to the nearest ER for care.    Pt verbalized understanding to plan of care.

## 2022-11-30 ENCOUNTER — Ambulatory Visit: Payer: BC Managed Care – PPO | Attending: Student | Admitting: Student

## 2022-11-30 ENCOUNTER — Ambulatory Visit: Payer: BC Managed Care – PPO

## 2022-11-30 ENCOUNTER — Encounter: Payer: Self-pay | Admitting: Student

## 2022-11-30 VITALS — BP 102/74 | HR 71 | Ht 68.0 in | Wt 140.0 lb

## 2022-11-30 DIAGNOSIS — R079 Chest pain, unspecified: Secondary | ICD-10-CM

## 2022-11-30 DIAGNOSIS — I456 Pre-excitation syndrome: Secondary | ICD-10-CM | POA: Diagnosis not present

## 2022-11-30 DIAGNOSIS — R002 Palpitations: Secondary | ICD-10-CM

## 2022-11-30 DIAGNOSIS — I493 Ventricular premature depolarization: Secondary | ICD-10-CM

## 2022-11-30 MED ORDER — OMEPRAZOLE 20 MG PO CPDR: 20.0000 mg | DELAYED_RELEASE_CAPSULE | Freq: Every day | ORAL | 3 refills | Status: DC

## 2022-11-30 NOTE — Progress Notes (Signed)
  Electrophysiology Office Note:   Date:  11/30/2022  ID:  Dana Graves, DOB [birth year], MRN [id number]  Primary Cardiologist: Debbe Odea, MD Electrophysiologist: Lewayne Bunting, MD   History of Present Illness:   Dana Graves is a 47 y.o. female with h/o WPW syndrome, PVCs s/p ablation , intermittent atypical chest pain, SVT, and HTN seen today for acute visit due to intermittent CP.    Patient reports doing well overall, though for the past few weeks she has had intermittent burning/discomfort in her chest. In hindsight, it started around the time she initiated supplementation with apple cider vinegar and turmeric, and a cursory search on line shows this can exacerbate GERD. She exercises most days, getting her HR up into the 130-140s with 30 minutes or more on the treadmill. Asymptomatic during this. No association with her chest discomfort and exertion. The pain is describes as a burning/pressure that is mostly up and down the center of her chest, but is occasionally more global across the top of her chest.   Arrhythmia history PVCs s/p ablation of septal RVOT just under the pulmonic valve  Review of systems complete and found to be negative unless listed in HPI.   Studies Reviewed:    EKG is ordered today. Personal review shows NSR at 71 bpm with short PR interval consistent with prior. No new T wave or ST changes   Physical Exam:   VS:  BP 102/74   Pulse 71   Ht 5\' 8"  (1.727 m)   Wt 140 lb (63.5 kg)   SpO2 97%   BMI 21.29 kg/m    Wt Readings from Last 3 Encounters:  11/30/22 140 lb (63.5 kg)  01/26/22 153 lb 12.8 oz (69.8 kg)  06/23/21 155 lb (70.3 kg)     GEN: Well nourished, well developed in no acute distress NECK: No JVD; No carotid bruits CARDIAC: Regular rate and rhythm, no murmurs, rubs, gallops RESPIRATORY:  Clear to auscultation without rales, wheezing or rhonchi  ABDOMEN: Soft, non-tender, non-distended EXTREMITIES:  No edema; No deformity    ASSESSMENT AND PLAN:    WPW PVCs Stable from previous Previous monitor with low burden PVCs and rare SVT Atypical chest pain is NOT associated with tachycardia by her apple watch.   Chest pain Intermittent, chronic component Worse in the setting of nightly Turmeric + Apple Cider Vinegar. She did not take it last night and has had no symptoms today.  Prior calcium score 0 and monitor unrevealing If symptoms persist would update monitor -> ischemic work up though VERY low suspicion of cardiac source.   Vertigo Stable. Per PCP  Follow up with Dr. Ladona Ridgel in 6 months  Signed, Graciella Freer, PA-C

## 2022-11-30 NOTE — Patient Instructions (Signed)
Medication Instructions:  Your physician recommends that you continue on your current medications as directed. Please refer to the Current Medication list given to you today.  *If you need a refill on your cardiac medications before your next appointment, please call your pharmacy*  Lab Work: None ordered If you have labs (blood work) drawn today and your tests are completely normal, you will receive your results only by: MyChart Message (if you have MyChart) OR A paper copy in the mail If you have any lab test that is abnormal or we need to change your treatment, we will call you to review the results.  Follow-Up: At [facility], you and your health needs are our priority.  As part of our continuing mission to provide you with exceptional heart care, we have created designated Provider Care Teams.  These Care Teams include your primary Cardiologist (physician) and Advanced Practice Providers (APPs -  Physician Assistants and Nurse Practitioners) who all work together to provide you with the care you need, when you need it.  Your next appointment:   6 month(s)  Provider:   Gregg Taylor, MD   

## 2022-11-30 NOTE — Telephone Encounter (Signed)
Follow up call to patient to review message and instructions from Dr Ladona Ridgel. Patient was seen in office today by Otilio Saber, PA.  Patient has no further questions or needs at this time.

## 2022-12-01 ENCOUNTER — Telehealth: Payer: Self-pay

## 2022-12-01 NOTE — Telephone Encounter (Signed)
Pt called back to follow up on Dr. Lubertha Basque MyChart orders.    Pt stated since seeing Mr. Tillery PA-C, she is not interested in the 7 day zio heart monitor, and has tried omeprazole in the past, and did not want that.  She feels better now.  Pt advised to reach out to [facility] if she needs an appointment with Dr. Ladona Ridgel, or has new / worsening symptoms.  Pt verbalized understanding.

## 2023-01-04 ENCOUNTER — Encounter: Payer: Self-pay | Admitting: Internal Medicine

## 2023-01-05 ENCOUNTER — Telehealth: Payer: Self-pay

## 2023-01-05 NOTE — Telephone Encounter (Signed)
     Primary Cardiologist: Debbe Odea, MD  Chart reviewed as part of pre-operative protocol coverage. Given past medical history and time since last visit, based on ACC/AHA guidelines, Dana Graves would be at acceptable risk for the planned procedure without further cardiovascular testing.   Her RCRI is a class I risk, 0.4% risk of major cardiac event.   I will route this recommendation to the requesting party via Epic fax function and remove from pre-op pool.  Please call with questions.  [name]. [name] NP-C     01/05/2023, 1:16 PM [facility] [address] Office [phone] Fax [phone]

## 2023-01-05 NOTE — Telephone Encounter (Signed)
   Pre-operative Risk Assessment    Patient Name: Dana Graves  DOB: 1975-08-23 MRN: [id number]      Request for Surgical Clearance    Procedure:   Remove bilateral saline implants, bilateral gel augmentation/mastopexy   Date of Surgery:  Clearance 03/29/23                                 Surgeon:  Jeanie Cooks, M.D. Surgeon's Group or Practice Name:  [facility] Phone number:  [phone] Fax number:  [phone]   Type of Clearance Requested:   - Medical ; Patient not on any blood thinners   Type of Anesthesia:  General    Additional requests/questions:    Dana Graves   01/05/2023, 11:58 AM

## 2023-03-30 ENCOUNTER — Encounter: Payer: Self-pay | Admitting: Internal Medicine

## 2023-05-30 ENCOUNTER — Encounter: Payer: Self-pay | Admitting: Internal Medicine

## 2023-05-30 ENCOUNTER — Ambulatory Visit: Payer: BC Managed Care – PPO | Attending: Internal Medicine | Admitting: Internal Medicine

## 2023-05-30 VITALS — BP 110/62 | HR 77 | Ht 68.0 in | Wt 144.0 lb

## 2023-05-30 DIAGNOSIS — I493 Ventricular premature depolarization: Secondary | ICD-10-CM | POA: Diagnosis not present

## 2023-05-30 DIAGNOSIS — I456 Pre-excitation syndrome: Secondary | ICD-10-CM

## 2023-05-30 NOTE — Patient Instructions (Signed)
Medication Instructions:  Your physician recommends that you continue on your current medications as directed. Please refer to the Current Medication list given to you today.  *If you need a refill on your cardiac medications before your next appointment, please call your pharmacy*  Lab Work: None ordered.  If you have labs (blood work) drawn today and your tests are completely normal, you will receive your results only by: MyChart Message (if you have MyChart) OR A paper copy in the mail If you have any lab test that is abnormal or we need to change your treatment, we will call you to review the results.  Testing/Procedures: None ordered.  Follow-Up: At [facility], you and your health needs are our priority.  As part of our continuing mission to provide you with exceptional heart care, we have created designated Provider Care Teams.  These Care Teams include your primary Cardiologist (physician) and Advanced Practice Providers (APPs -  Physician Assistants and Nurse Practitioners) who all work together to provide you with the care you need, when you need it.  We recommend signing up for the patient portal called "MyChart".  Sign up information is provided on this After Visit Summary.  MyChart is used to connect with patients for Virtual Visits (Telemedicine).  Patients are able to view lab/test results, encounter notes, upcoming appointments, etc.  Non-urgent messages can be sent to your provider as well.   To learn more about what you can do with MyChart, go to [email or web address].    Your next appointment:   As Needed  The format for your next appointment:   In Person  Provider:   Lewayne Bunting, MD{or one of the following Advanced Practice Providers on your designated Care Team:   Francis Dowse, [place] Casimiro Needle "Mardelle Matte" [place], [place] Earnest Rosier, NP   Important Information About Sugar

## 2023-05-30 NOTE — Progress Notes (Signed)
HPI Ms. Masarik returns today for followup. She is a pleasant 47 yo woman with a h/o WPW pattern on her ECG who's AP conduction has been intermittent. She the developed worsening PVC's and underwent EP study and ablation of the PVC's which were located on the septal side of the RVOT just under the pulmonic valve. Post ablation she had less than 1% PVC's on Zio monitor. I saw her a month later and despite no evidence of AP conduction during the ep study, she had some pre-excitation on her ECG which was again present about 6 months ago. In the interim she notes occasional palpitations.  No Known Allergies   Current Outpatient Medications  Medication Sig Dispense Refill   Cyanocobalamin (VITAMIN B-12 IJ) Inject 1,000 mg as directed.     KURVELO 0.15-30 MG-MCG tablet Take 1 tablet by mouth in the morning.     omeprazole (PRILOSEC) 20 MG capsule Take 1 capsule (20 mg total) by mouth daily. 180 capsule 3   No current facility-administered medications for this visit.     Past Medical History:  Diagnosis Date   B12 deficiency    Dyspareunia    Pelvic pain in female    PVC's (premature ventricular contractions)    Seasonal allergies    Ventricular pre-excitation    Wears contact lenses    WPW (Wolff-Parkinson-White syndrome)     ROS:   All systems reviewed and negative except as noted in the HPI.   Past Surgical History:  Procedure Laterality Date   BREAST ENHANCEMENT SURGERY  feb  2002   ESOPHAGOGASTRODUODENOSCOPY N/A 02/16/2021   Procedure: ESOPHAGOGASTRODUODENOSCOPY (EGD);  Surgeon: Toledo, Boykin Nearing, MD;  Location: [facility];  Service: Gastroenterology;  Laterality: N/A;   LAPAROSCOPY N/A 12/13/2014   Procedure: LAPAROSCOPY DIAGNOSTIC ;  Surgeon: Zelphia Cairo, MD;  Location: [facility];  Service: Gynecology;  Laterality: N/A;   LAPAROSCOPY FOR ECTOPIC PREGNANCY  2009 approx.   Unilateral Salpingostomy   PVC ABLATION N/A 06/23/2021   Procedure: PVC  ABLATION;  Surgeon: Marinus Maw, MD;  [facility];  Service: Cardiovascular;  Laterality: N/A;   V TACH ABLATION N/A 06/23/2021   Procedure: WPW ABLATION;  Surgeon: Marinus Maw, MD;  Location: [facility];  Service: Cardiovascular;  Laterality: N/A;     Family History  Problem Relation Age of Onset   Breast cancer Maternal Grandmother    Colon cancer Paternal Grandfather    Diabetes Mother    Colon polyps Father    Heart attack Maternal Grandfather      Social History   Socioeconomic History   Marital status: Married    Spouse name: Not on file   Number of children: 2   Years of education: Not on file   Highest education level: Not on file  Occupational History   Occupation: work from home  Tobacco Use   Smoking status: Never   Smokeless tobacco: Never  Vaping Use   Vaping status: Never Used  Substance and Sexual Activity   Alcohol use: Yes    Alcohol/week: 7.0 standard drinks of alcohol    Types: 7 Glasses of wine per week    Comment: 1-2 DAILY WINE   Drug use: No   Sexual activity: Yes    Birth control/protection: Pill  Other Topics Concern   Not on file  Social History Narrative   Not on file   Social Determinants of Health   Financial Resource Strain:  Low Risk  (09/13/2022)   Received from [facility], [facility]   Overall Financial Resource Strain (CARDIA)    Difficulty of Paying Living Expenses: Not hard at all  Food Insecurity: No Food Insecurity (09/13/2022)   Received from [facility], [facility]   Hunger Vital Sign    Worried About Running Out of Food in the Last Year: Never true    Ran Out of Food in the Last Year: Never true  Transportation Needs: No Transportation Needs (09/13/2022)   Received from [facility], [facility]   [facility] - Transportation    In the past 12 months, has lack of transportation kept you  from medical appointments or from getting medications?: No    Lack of Transportation (Non-Medical): No  Physical Activity: Not on file  Stress: Not on file  Social Connections: Not on file  Intimate Partner Violence: Not on file     BP 110/62   Pulse 77   Ht 5\' 8"  (1.727 m)   Wt 144 lb (65.3 kg)   SpO2 99%   BMI 21.90 kg/m   Physical Exam:  Well appearing NAD HEENT: Unremarkable Neck:  No JVD, no thyromegally Lymphatics:  No adenopathy Back:  No CVA tenderness Lungs:  Clear HEART:  Regular rate rhythm, no murmurs, no rubs, no clicks Abd:  soft, positive bowel sounds, no organomegally, no rebound, no guarding Ext:  2 plus pulses, no edema, no cyanosis, no clubbing Skin:  No rashes no nodules Neuro:  CN II through XII intact, motor grossly intact  EKG - NSR with WPW  DEVICE  Normal device function.  See PaceArt for details.   Assess/Plan:  PVC's - she has none on the ECG and no symptoms. She will undergo watchful waiting. WPW - despite no AP conduction when we did her PVC ablation, she has evidence of a posteroseptal AP on her last 3 ecgs. She is minimally symptomatic and will undergo watchful waiting. I asked her to call us if she were to develop HR's in the 170 range or higher which would indicate that she is having SVT which we have not documented.   Sharlot Gowda Riyan Gavina,MD

## 2023-07-25 ENCOUNTER — Encounter: Payer: Self-pay | Admitting: Internal Medicine

## 2023-07-29 ENCOUNTER — Other Ambulatory Visit: Payer: Self-pay | Admitting: Internal Medicine

## 2023-07-29 DIAGNOSIS — R42 Dizziness and giddiness: Secondary | ICD-10-CM

## 2023-08-06 ENCOUNTER — Ambulatory Visit
Admission: RE | Admit: 2023-08-06 | Discharge: 2023-08-06 | Disposition: A | Discharge status: Home / Self Care | Payer: BC Managed Care – PPO | Source: Ambulatory Visit | Attending: Internal Medicine | Admitting: Internal Medicine

## 2023-08-06 DIAGNOSIS — R42 Dizziness and giddiness: Secondary | ICD-10-CM

## 2023-08-15 ENCOUNTER — Encounter: Payer: Self-pay | Admitting: Internal Medicine

## 2023-08-31 NOTE — Progress Notes (Signed)
 Referring Physician:  Lynnea Ferrier, MD [address] [facility] [place],  [place] [address]  Primary Physician:  Lynnea Ferrier, MD  History of Present Illness: 09/02/23 Dana Graves is here today with a chief complaint of dizziness x 1 year.  She states this is positional and begins if she extends her head backwards surgery for a long period of time like when she gets her hair done at the salon.  She adds that she has been seen by ENT for possible vertigo however that has not had no effect on anything.  She denies any nausea, but the dizziness does improve rather quickly as long as her neck has not been extended for a prolonged period of time.  She denies any neck pain.  She states that occasionally she will have some right arm tingling in her elbow if she is laying against it however this is not consistent or bothersome for her.  She denies any weakness or issues with her gait.  Conservative measures:  Physical therapy:  has not participated in PT Multimodal medical therapy including regular antiinflammatories: none  Injections: no epidural steroid injections  Past Surgery: None  Dana Graves has no symptoms of cervical myelopathy.  The symptoms are causing a significant impact on the patient's life.   Review of Systems:  A 10 point review of systems is negative, except for the pertinent positives and negatives detailed in the HPI.  Past Medical History: Past Medical History:  Diagnosis Date   B12 deficiency    Dyspareunia    Pelvic pain in female    PVC's (premature ventricular contractions)    Seasonal allergies    Ventricular pre-excitation    Wears contact lenses    WPW (Wolff-Parkinson-White syndrome)     Past Surgical History: Past Surgical History:  Procedure Laterality Date   BREAST ENHANCEMENT SURGERY  feb  2002   ESOPHAGOGASTRODUODENOSCOPY N/A 02/16/2021   Procedure: ESOPHAGOGASTRODUODENOSCOPY (EGD);  Surgeon: Toledo,  Boykin Nearing, MD;  Location: [facility];  Service: Gastroenterology;  Laterality: N/A;   LAPAROSCOPY N/A 12/13/2014   Procedure: LAPAROSCOPY DIAGNOSTIC ;  Surgeon: Zelphia Cairo, MD;  Location: [facility];  Service: Gynecology;  Laterality: N/A;   LAPAROSCOPY FOR ECTOPIC PREGNANCY  2009 approx.   Unilateral Salpingostomy   PVC ABLATION N/A 06/23/2021   Procedure: PVC ABLATION;  Surgeon: Marinus Maw, MD;  [facility];  Service: Cardiovascular;  Laterality: N/A;   V TACH ABLATION N/A 06/23/2021   Procedure: WPW ABLATION;  Surgeon: Marinus Maw, MD;  Location: [facility];  Service: Cardiovascular;  Laterality: N/A;    Allergies: Allergies as of 09/02/2023   (No Known Allergies)    Medications: Outpatient Encounter Medications as of 09/02/2023  Medication Sig   LO LOESTRIN FE 1 MG-10 MCG / 10 MCG tablet Take 1 tablet by mouth daily.   Cyanocobalamin (VITAMIN B-12 IJ) Inject 1,000 mg as directed.   [DISCONTINUED] KURVELO 0.15-30 MG-MCG tablet Take 1 tablet by mouth in the morning.   [DISCONTINUED] omeprazole (PRILOSEC) 20 MG capsule Take 1 capsule (20 mg total) by mouth daily.   No facility-administered encounter medications on file as of 09/02/2023.    Social History: Social History   Tobacco Use   Smoking status: Never   Smokeless tobacco: Never  Vaping Use   Vaping status: Never Used  Substance Use Topics   Alcohol use: Yes    Alcohol/week: 7.0 standard drinks  of alcohol    Types: 7 Glasses of wine per week    Comment: 1-2 DAILY WINE   Drug use: No    Family Medical History: Family History  Problem Relation Age of Onset   Breast cancer Maternal Grandmother    Colon cancer Paternal Grandfather    Diabetes Mother    Colon polyps Father    Heart attack Maternal Grandfather     Physical Examination: @VITALWITHPAIN @  General: Patient is well developed, well nourished, calm, collected, and in no apparent distress. Attention  to examination is appropriate.  Psychiatric: Patient is non-anxious.  Head:  Pupils equal, round, and reactive to light.  ENT:  Oral mucosa appears well hydrated.  Neck:   Supple.  Full range of motion.  Respiratory: Patient is breathing without any difficulty.  Extremities: No edema.  Vascular: Palpable dorsal pedal pulses.  Skin:   On exposed skin, there are no abnormal skin lesions.  NEUROLOGICAL:     Awake, alert, oriented to person, place, and time.  Speech is clear and fluent. Fund of knowledge is appropriate.   Cranial Nerves: Pupils equal round and reactive to light.  Facial tone is symmetric.   ROM of spine: full.  Palpation of spine: non tender.    Strength: Side Biceps Triceps Deltoid Interossei Grip Wrist Ext. Wrist Flex.  R 5 5 5 5 5 5 5   L 5 5 5 5 5 5 5     Reflexes are 2+ and symmetric at the biceps, triceps, brachioradialis. Bilateral upper extremity sensation is intact to light touch.    Gait is normal.   No difficulty with tandem gait.   No evidence of dysmetria noted.  Medical Decision Making  Imaging: MRI Cervical spine 08/19/23:  IMPRESSION: Small central slightly right and left paracentral disc herniation at C5-C6 effacing the ventral anterior epidural space flattening of the thecal sac and cord without cord compression. No foraminal narrowing.    I have personally reviewed the images and agree with the above interpretation.  Assessment and Plan: Dana Graves is a pleasant 48 y.o. female is here today with a chief complaint of dizziness x 1 year.  She states this is positional and begins if she extends her head backwards surgery for a long period of time like when she gets her hair done at the salon.  She adds that she has been seen by ENT for possible vertigo however that has not had no effect on anything.  She denies any nausea, but the dizziness does improve rather quickly as long as her neck has not been extended for a prolonged period of  time.  She denies any neck pain.  She states that occasionally she will have some right arm tingling in her elbow if she is laying against it however this is not consistent or bothersome for her.  She denies any weakness or issues with her gait.  She has full strength on examination.  No Hoffmann sign.  She is normoreflexic.  It was a pleasure to see patient in clinic today.  She does have a small disc protrusion at C5-6 however she has very little symptoms related to cervical radiculopathy that are bothersome to her.  Her largest complaint and concern is her dizziness that is largely positional.  Plan for CTA of her neck to evaluate vasculature to rule out any significant changes that would cause lack of blood flow or dizziness.  Will review results once complete.  Thank you for involving me in the care  of this patient.   Joan Flores, PA-C Dept. of Neurosurgery

## 2023-09-02 ENCOUNTER — Ambulatory Visit: Admitting: Physician Assistant

## 2023-09-02 ENCOUNTER — Encounter: Payer: Self-pay | Admitting: Physician Assistant

## 2023-09-02 VITALS — BP 102/70 | Ht 68.0 in | Wt 136.0 lb

## 2023-09-02 DIAGNOSIS — R937 Abnormal findings on diagnostic imaging of other parts of musculoskeletal system: Secondary | ICD-10-CM

## 2023-09-02 DIAGNOSIS — R42 Dizziness and giddiness: Secondary | ICD-10-CM | POA: Diagnosis not present

## 2023-09-02 DIAGNOSIS — M50122 Cervical disc disorder at C5-C6 level with radiculopathy: Secondary | ICD-10-CM

## 2023-09-03 ENCOUNTER — Encounter: Payer: Self-pay | Admitting: Internal Medicine

## 2023-09-05 NOTE — Telephone Encounter (Signed)
 Spoke with patient, encouraged patient to focus on staying hydrated and to start slow and infrequent with sauna sessions. Educated patient that dehydration can precipitate cardiac arrhythmias. Patient thankful for the call back

## 2023-09-14 DIAGNOSIS — M5090 Cervical disc disorder, unspecified, unspecified cervical region: Secondary | ICD-10-CM | POA: Insufficient documentation

## 2023-10-04 ENCOUNTER — Ambulatory Visit

## 2023-10-13 ENCOUNTER — Other Ambulatory Visit: Payer: Self-pay | Admitting: Unknown Physician Specialty

## 2023-10-13 DIAGNOSIS — R42 Dizziness and giddiness: Secondary | ICD-10-CM

## 2023-10-25 ENCOUNTER — Ambulatory Visit
Admission: RE | Admit: 2023-10-25 | Discharge: 2023-10-25 | Disposition: A | Discharge status: Home / Self Care | Source: Ambulatory Visit | Attending: Unknown Physician Specialty | Admitting: Unknown Physician Specialty

## 2023-10-25 DIAGNOSIS — R42 Dizziness and giddiness: Secondary | ICD-10-CM

## 2023-10-25 MED ORDER — GADOPICLENOL 0.5 MMOL/ML IV SOLN
7.5000 mL | Freq: Once | INTRAVENOUS | Status: AC | PRN
  Administered 2023-10-25: 7.5 mL via INTRAVENOUS

## 2023-11-21 ENCOUNTER — Encounter: Payer: Self-pay | Admitting: Internal Medicine

## 2024-02-07 ENCOUNTER — Encounter: Payer: Self-pay | Admitting: Internal Medicine

## 2024-02-08 ENCOUNTER — Encounter: Payer: Self-pay | Admitting: Pharmacist

## 2024-02-08 ENCOUNTER — Other Ambulatory Visit: Payer: Self-pay | Admitting: Pharmacist

## 2024-02-08 NOTE — Progress Notes (Deleted)
 Yes, should be fine to take. Patient should complete entire course of antibiotics

## 2024-02-22 ENCOUNTER — Encounter: Payer: Self-pay | Admitting: Internal Medicine

## 2024-03-23 ENCOUNTER — Encounter: Payer: Self-pay | Admitting: Internal Medicine

## 2024-04-02 ENCOUNTER — Encounter: Payer: Self-pay | Admitting: Internal Medicine

## 2024-04-11 ENCOUNTER — Other Ambulatory Visit: Payer: Self-pay

## 2024-04-11 ENCOUNTER — Ambulatory Visit: Attending: Internal Medicine

## 2024-04-11 DIAGNOSIS — I493 Ventricular premature depolarization: Secondary | ICD-10-CM

## 2024-04-11 NOTE — Progress Notes (Unsigned)
 Enrolled patient for a 3 day Zio XT monitor to be mailed to patients home

## 2024-04-26 ENCOUNTER — Encounter: Payer: Self-pay | Admitting: Internal Medicine

## 2024-05-13 DIAGNOSIS — I493 Ventricular premature depolarization: Secondary | ICD-10-CM | POA: Diagnosis not present

## 2024-05-14 ENCOUNTER — Ambulatory Visit: Payer: Self-pay | Admitting: Internal Medicine

## 2024-05-21 ENCOUNTER — Encounter: Payer: Self-pay | Admitting: Internal Medicine

## 2024-06-18 ENCOUNTER — Encounter: Payer: Self-pay | Admitting: Internal Medicine
